# Patient Record
Sex: Male | Born: 1955 | Race: White | Hispanic: No | Marital: Married | Smoking: Former smoker
Health system: Southern US, Community
[De-identification: ages and names within clinical notes are randomized; demographics above are authoritative.]

## PROBLEM LIST (undated history)

## (undated) DIAGNOSIS — E119 Type 2 diabetes mellitus without complications: Secondary | ICD-10-CM

## (undated) DIAGNOSIS — I4891 Unspecified atrial fibrillation: Secondary | ICD-10-CM

## (undated) DIAGNOSIS — I499 Cardiac arrhythmia, unspecified: Secondary | ICD-10-CM

## (undated) DIAGNOSIS — E7800 Pure hypercholesterolemia, unspecified: Secondary | ICD-10-CM

## (undated) DIAGNOSIS — G4730 Sleep apnea, unspecified: Secondary | ICD-10-CM

## (undated) DIAGNOSIS — G629 Polyneuropathy, unspecified: Secondary | ICD-10-CM

## (undated) DIAGNOSIS — M1990 Unspecified osteoarthritis, unspecified site: Secondary | ICD-10-CM

## (undated) HISTORY — DX: Unspecified atrial fibrillation: I48.91

## (undated) HISTORY — PX: CARPAL TUNNEL RELEASE: SHX101

## (undated) HISTORY — PX: TONSILLECTOMY: SUR1361

## (undated) HISTORY — PX: APPENDECTOMY: SHX54

## (undated) HISTORY — PX: TOTAL KNEE ARTHROPLASTY: SHX125

## (undated) HISTORY — DX: Cardiac arrhythmia, unspecified: I49.9

## (undated) HISTORY — PX: JOINT REPLACEMENT: SHX530

---

## 2005-04-16 ENCOUNTER — Ambulatory Visit: Payer: Self-pay | Admitting: Unknown Physician Specialty

## 2009-05-26 ENCOUNTER — Inpatient Hospital Stay (HOSPITAL_COMMUNITY): Admission: RE | Admit: 2009-05-26 | Discharge: 2009-05-28 | Payer: Self-pay | Admitting: Orthopedic Surgery

## 2010-04-19 ENCOUNTER — Ambulatory Visit: Payer: Self-pay | Admitting: Internal Medicine

## 2010-05-06 LAB — URINE CULTURE: Colony Count: NO GROWTH

## 2010-05-06 LAB — URINALYSIS, ROUTINE W REFLEX MICROSCOPIC
Bilirubin Urine: NEGATIVE
Glucose, UA: NEGATIVE mg/dL
Ketones, ur: NEGATIVE mg/dL
Protein, ur: NEGATIVE mg/dL
Specific Gravity, Urine: 1.018 (ref 1.005–1.030)
pH: 6.5 (ref 5.0–8.0)

## 2010-05-06 LAB — BASIC METABOLIC PANEL
BUN: 6 mg/dL (ref 6–23)
CO2: 27 mEq/L (ref 19–32)
Calcium: 8.4 mg/dL (ref 8.4–10.5)
Chloride: 100 mEq/L (ref 96–112)
GFR calc Af Amer: >60 mL/min (ref >60)
GFR calc non Af Amer: >60 mL/min (ref >60)
GFR calc non Af Amer: >60 mL/min (ref >60)
Glucose, Bld: 127 mg/dL — ABNORMAL HIGH (ref 70–99)
Glucose, Bld: 142 mg/dL — ABNORMAL HIGH (ref 70–99)
Potassium: 3.4 mEq/L — ABNORMAL LOW (ref 3.5–5.1)
Potassium: 3.9 mEq/L (ref 3.5–5.1)

## 2010-05-06 LAB — COMPREHENSIVE METABOLIC PANEL
Creatinine, Ser: 0.91 mg/dL (ref 0.4–1.5)
Potassium: 4.2 mEq/L (ref 3.5–5.1)
Sodium: 136 mEq/L (ref 135–145)

## 2010-05-06 LAB — CBC
Hemoglobin: 16 g/dL (ref 13.0–17.0)
MCHC: 34.5 g/dL (ref 30.0–36.0)
MCV: 84.8 fL (ref 78.0–100.0)
MCV: 84.9 fL (ref 78.0–100.0)
Platelets: 213 10*3/uL (ref 150–400)
Platelets: 232 10*3/uL (ref 150–400)
RBC: 4.29 MIL/uL (ref 4.22–5.81)
RBC: 5.47 MIL/uL (ref 4.22–5.81)
RDW: 14.6 % (ref 11.5–15.5)
WBC: 12.8 10*3/uL — ABNORMAL HIGH (ref 4.0–10.5)

## 2010-05-06 LAB — CROSSMATCH: ABO/RH(D): O POS

## 2010-05-06 LAB — PROTIME-INR: INR: 0.99 (ref 0.00–1.49)

## 2010-05-06 LAB — DIFFERENTIAL
Basophils Relative: 1 % (ref 0–1)
Eosinophils Absolute: 0.1 10*3/uL (ref 0.0–0.7)
Lymphocytes Relative: 30 % (ref 12–46)
Lymphs Abs: 3.1 10*3/uL (ref 0.7–4.0)
Monocytes Absolute: 0.8 10*3/uL (ref 0.1–1.0)

## 2010-12-15 ENCOUNTER — Ambulatory Visit: Payer: Self-pay | Admitting: Specialist

## 2010-12-22 ENCOUNTER — Ambulatory Visit: Payer: Self-pay | Admitting: Specialist

## 2011-01-12 ENCOUNTER — Ambulatory Visit: Payer: Self-pay | Admitting: Specialist

## 2012-08-08 DIAGNOSIS — E781 Pure hyperglyceridemia: Secondary | ICD-10-CM | POA: Insufficient documentation

## 2013-01-15 ENCOUNTER — Ambulatory Visit (INDEPENDENT_AMBULATORY_CARE_PROVIDER_SITE_OTHER): Payer: 59 | Admitting: Podiatry

## 2013-01-15 ENCOUNTER — Encounter: Payer: Self-pay | Admitting: Podiatry

## 2013-01-15 VITALS — BP 135/74 | HR 56 | Resp 16 | Ht 70.5 in | Wt 245.0 lb

## 2013-01-15 DIAGNOSIS — M722 Plantar fascial fibromatosis: Secondary | ICD-10-CM

## 2013-01-15 NOTE — Progress Notes (Signed)
Louis Jensen presents today as a 57 year old white male with chronic intractable plantar fascial symptoms of his left foot. He has tried many conservative therapies consisting of; orthotics shoe gear changes, injections, steroids, nonsteroidals, night splints, strappings and physical therapy. None of this has alleviated his symptoms. He still complaining of not being able stand for long periods of time without pain. He relates that he is no longer able to enjoy life and that his left foot limits his daily activities.  Objective: Strong palpable pulses left lower extremity. Pain on palpation medial calcaneal tubercle in the central band of the plantar fascia extending to the medial arch. His toes are tight and hard to move dorsiflexion and plantar flexion.  Assessment: Chronic intractable plantar fasciitis left foot.  Plan: MRI to confirm that her fasciitis for surgical degeneration.

## 2013-01-16 ENCOUNTER — Telehealth: Payer: Self-pay | Admitting: *Deleted

## 2013-01-16 NOTE — Telephone Encounter (Signed)
CALLED AND SPOKE WITH PT LETTING HIM KNOW MRI IS SCHEDULED FOR MON 12.8.14 AT 3:45. WILL NEED TO ARRIVE AT 3:15 AT [facility]

## 2013-01-22 ENCOUNTER — Ambulatory Visit: Payer: Self-pay | Admitting: Podiatry

## 2013-02-12 ENCOUNTER — Telehealth: Payer: Self-pay | Admitting: Podiatry

## 2013-02-12 ENCOUNTER — Encounter: Payer: Self-pay | Admitting: Podiatry

## 2013-02-12 NOTE — Telephone Encounter (Signed)
Left message with wife for him to schedule an appointment for MRI results

## 2013-02-14 ENCOUNTER — Encounter: Payer: Self-pay | Admitting: Podiatry

## 2013-02-14 ENCOUNTER — Ambulatory Visit (INDEPENDENT_AMBULATORY_CARE_PROVIDER_SITE_OTHER): Payer: 59 | Admitting: Podiatry

## 2013-02-14 VITALS — BP 140/81 | HR 62 | Resp 16 | Ht 70.0 in | Wt 245.0 lb

## 2013-02-14 DIAGNOSIS — B351 Tinea unguium: Secondary | ICD-10-CM

## 2013-02-14 DIAGNOSIS — B353 Tinea pedis: Secondary | ICD-10-CM

## 2013-02-14 DIAGNOSIS — M722 Plantar fascial fibromatosis: Secondary | ICD-10-CM

## 2013-02-14 MED ORDER — CEPHALEXIN 500 MG PO CAPS: 500.0000 mg | ORAL_CAPSULE | Freq: Four times a day (QID) | ORAL | Status: DC

## 2013-02-14 MED ORDER — NAFTIFINE HCL 2 % EX CREA: 1.0000 "application " | TOPICAL_CREAM | CUTANEOUS | Status: DC

## 2013-02-14 NOTE — Progress Notes (Signed)
He presents today for his MRI results of his left foot. Currently he is still has heel pain he also has a violaceous rash to the plantar aspect of each foot with vesicular purulent eruptions small petechial lesions and dry xerotic skin.  Objective: We discussed the MRI today he still has pain on palpation medial continued tubercle of his left heel. Violaceous rash noted as above.  Assessment: Chronic intractable plantar fasciitis left. Dermatitis probable tinea pedis rule out eczematous dermatitis.  Plan: Injected his left heel with Kenalog and local anesthetic today. He started him on Keflex 500 mg 1 by mouth 3 times a day. Also started him on Naftin cream.

## 2013-02-28 ENCOUNTER — Ambulatory Visit: Payer: 59 | Admitting: Podiatry

## 2013-03-05 ENCOUNTER — Encounter: Payer: Self-pay | Admitting: Podiatry

## 2013-03-05 ENCOUNTER — Ambulatory Visit (INDEPENDENT_AMBULATORY_CARE_PROVIDER_SITE_OTHER): Payer: 59 | Admitting: Podiatry

## 2013-03-05 VITALS — BP 127/72 | HR 74 | Resp 18

## 2013-03-05 DIAGNOSIS — L309 Dermatitis, unspecified: Secondary | ICD-10-CM

## 2013-03-05 DIAGNOSIS — L259 Unspecified contact dermatitis, unspecified cause: Secondary | ICD-10-CM

## 2013-03-05 MED ORDER — DESOXIMETASONE 0.25 % EX CREA: 1.0000 "application " | TOPICAL_CREAM | Freq: Two times a day (BID) | CUTANEOUS | Status: DC

## 2013-03-05 NOTE — Progress Notes (Signed)
The heel is about the same , the dermatitis is good on left , terrible on the right foot.  Objective evaluation: Vital signs are stable he is alert and oriented x3. Dermatitis to the left foot is completely resolved. Right foot does demonstrate erythema and violaceous gold plaques with drainage and malodor. Minimal tenderness on palpation of the medial continued tubercles bilateral.  Assessment: At this point I will add Topicort to his regimen. We are also going to send him to aliments dermatology.  Plan: Adding Topicort sending to dermatology and will await the findings. Once this is cleared for surgery will be performed.

## 2013-03-26 ENCOUNTER — Ambulatory Visit: Payer: 59 | Admitting: Podiatry

## 2013-03-28 ENCOUNTER — Ambulatory Visit: Payer: 59 | Admitting: Podiatry

## 2014-01-14 ENCOUNTER — Ambulatory Visit (INDEPENDENT_AMBULATORY_CARE_PROVIDER_SITE_OTHER): Payer: 59 | Admitting: Podiatry

## 2014-01-14 VITALS — BP 120/80 | HR 66 | Resp 16

## 2014-01-14 DIAGNOSIS — M722 Plantar fascial fibromatosis: Secondary | ICD-10-CM

## 2014-01-14 NOTE — Progress Notes (Signed)
He presents today states that his left foot is just killing him. Is beginning to affect his ability to perform his daily activities and continue with his exercise regimen. He states that he is recently started to feel some shooting pain along the medial longitudinal arch particularly at night after he goes to bed.  Objective: Vital signs are stable he is alert and oriented 3. Pulses are strongly palpable left foot. He has pain on palpation medial calcaneal tubercle of the left heel. Allograft assessment: Chronic intractable plantar fasciitis of the left foot. Also demonstrating some signs of tarsal tunnel possibly associated with compensation or inflammation of the  posterior tibial tendon and the tarsal canal.  Assessment: Chronic intractable plantar fasciitis left heel.  Plan: Discussed etiology pathology conservative versus surgical therapies. We discussed in great detail today and endoscopic plantar fasciotomy of the left foot. I answered all the questions regarding this procedure to the best of my ability in layman's terms. We went over a consent form today line by line number by number giving him ample time to ask questions he saw fit. We also discussed the possible postop complications which may include but are not limited to postop pain bleeding swelling infection need for further surgery recurrence increase in pain. He signed all 3 pages of the consent form he was dispensed a Cam Walker and we will perform surgical procedure in January.

## 2014-02-03 ENCOUNTER — Ambulatory Visit: Payer: Self-pay | Admitting: Physician Assistant

## 2014-02-19 ENCOUNTER — Telehealth: Payer: Self-pay | Admitting: *Deleted

## 2014-02-19 NOTE — Telephone Encounter (Signed)
Returned pts call regarding his message to r/s surgery. Called and left message for pt to call back to r/s surgery.

## 2014-03-13 ENCOUNTER — Encounter: Payer: 59 | Admitting: Podiatry

## 2014-03-20 ENCOUNTER — Other Ambulatory Visit: Payer: Self-pay | Admitting: Podiatry

## 2014-03-20 MED ORDER — PROMETHAZINE HCL 25 MG PO TABS: 25.0000 mg | ORAL_TABLET | Freq: Three times a day (TID) | ORAL | Status: DC | PRN

## 2014-03-20 MED ORDER — OXYCODONE-ACETAMINOPHEN 10-325 MG PO TABS: ORAL_TABLET | ORAL | Status: DC

## 2014-03-20 MED ORDER — CEPHALEXIN 500 MG PO CAPS: 500.0000 mg | ORAL_CAPSULE | Freq: Three times a day (TID) | ORAL | Status: DC

## 2014-03-22 ENCOUNTER — Encounter: Payer: Self-pay | Admitting: Podiatry

## 2014-03-22 DIAGNOSIS — M722 Plantar fascial fibromatosis: Secondary | ICD-10-CM

## 2014-03-27 ENCOUNTER — Ambulatory Visit (INDEPENDENT_AMBULATORY_CARE_PROVIDER_SITE_OTHER): Payer: 59 | Admitting: Podiatry

## 2014-03-27 ENCOUNTER — Encounter: Payer: Self-pay | Admitting: Podiatry

## 2014-03-27 VITALS — BP 143/84 | HR 64 | Resp 12

## 2014-03-27 DIAGNOSIS — Z9889 Other specified postprocedural states: Secondary | ICD-10-CM

## 2014-03-27 NOTE — Progress Notes (Signed)
He presents today 5 days status post endoscopic plantar fasciotomy of his left foot. He denies fever chills nausea vomiting muscle aches and pains.  Objective: Vital signs are stable he is alert and oriented 3. Dry sterile dressing was removed demonstrates mild edema mild ecchymosis sutures are intact margins are coapted IC no signs of infection. Moderately tender on palpation.  Assessment: One-week status post endoscopic plantar fasciotomy. Date of surgery 03/22/2014.  Plan: Redress today. I will allow him to start soaking this in Epsom salts and warm water he is to continue to wear the Cam Walker around the clock. Follow up with him in 1 week for suture removal.

## 2014-03-29 NOTE — Progress Notes (Signed)
DOS 03/22/2014 left endoscopic plantar fasciotomy

## 2014-04-03 ENCOUNTER — Ambulatory Visit (INDEPENDENT_AMBULATORY_CARE_PROVIDER_SITE_OTHER): Payer: 59 | Admitting: Podiatry

## 2014-04-03 VITALS — BP 125/77 | HR 63 | Temp 97.7°F | Resp 16

## 2014-04-03 DIAGNOSIS — Z9889 Other specified postprocedural states: Secondary | ICD-10-CM

## 2014-04-03 NOTE — Progress Notes (Signed)
He presents today 2 weeks status post endoscopic plantar fasciotomy left foot. He denies fever chills nausea vomiting muscle aches and pains. He states that he has been sleeping without the Cam Walker.  Objective: Vital signs are stable he is alert and oriented 3 sutures were removed today margins remain well coapted. He has minimal pain on palpation medial calcaneal tubercle. I see no signs of infection.  Assessment: Well-healing endoscopic plantar fasciotomy left foot.  Plan: I will have him back in a pair of tennis shoes with his orthotics and he will continue to utilize the night splint for the next month. I will follow-up with him in 2 weeks.

## 2014-04-17 ENCOUNTER — Ambulatory Visit (INDEPENDENT_AMBULATORY_CARE_PROVIDER_SITE_OTHER): Payer: 59 | Admitting: Podiatry

## 2014-04-17 VITALS — BP 133/71 | HR 63 | Resp 16

## 2014-04-17 DIAGNOSIS — Z9889 Other specified postprocedural states: Secondary | ICD-10-CM

## 2014-04-17 NOTE — Progress Notes (Signed)
He presents today approximately one-month status post EPF left. He still having considerable pain with ambulation and cannot stand for long periods of time. He denies fever chills nausea vomiting muscle aches and pains.  Objective: Vital signs are stable he is alert and oriented 3. Pulses are palpable left foot. He has tenderness on palpation medial continued tubercle of the left heel margins appear to be well-healed and surgical sites are healed. Mild tenderness on palpation of the plantar medial and plantar central calcaneal tubercle.  Assessment: Endoscopic plantar fasciotomy progressing well.  Plan: Encouraged range of motion exercises massage therapy shoe gear modifications and ambulation. He will continue to utilize his night splint. Follow up with him in 2 weeks

## 2014-05-06 ENCOUNTER — Ambulatory Visit (INDEPENDENT_AMBULATORY_CARE_PROVIDER_SITE_OTHER): Payer: 59 | Admitting: Podiatry

## 2014-05-06 ENCOUNTER — Encounter: Payer: Self-pay | Admitting: Podiatry

## 2014-05-06 VITALS — BP 129/91 | HR 67 | Resp 16

## 2014-05-06 DIAGNOSIS — Z9889 Other specified postprocedural states: Secondary | ICD-10-CM

## 2014-05-06 NOTE — Progress Notes (Signed)
He presents now approximately 7 weeks status post endoscopic plantar fasciotomy of his left heel. He states this still hurts particularly after his been standing for long period of time. He continues physical therapy at home and continues massage therapy for scar therapy.  Objective: Vital signs are stable he is alert and oriented 3 asthma mild tenderness to the incision site of his left heel. There is no erythema edema saline as drainage or odor.  Assessment: Encouraged massage therapy and ambulation.  Plan: I'm going to suggest that he remain out of work one additional week. This should give him 2 weeks to develop a mean bite which to decrease his symptoms.

## 2014-05-20 ENCOUNTER — Ambulatory Visit (INDEPENDENT_AMBULATORY_CARE_PROVIDER_SITE_OTHER): Payer: 59 | Admitting: Podiatry

## 2014-05-20 ENCOUNTER — Encounter: Payer: Self-pay | Admitting: Podiatry

## 2014-05-20 VITALS — BP 140/90 | HR 53 | Resp 18 | Ht 70.5 in | Wt 248.0 lb

## 2014-05-20 DIAGNOSIS — Z9889 Other specified postprocedural states: Secondary | ICD-10-CM

## 2014-05-20 DIAGNOSIS — M722 Plantar fascial fibromatosis: Secondary | ICD-10-CM

## 2014-05-20 NOTE — Progress Notes (Signed)
He presents today for follow-up of his endoscopic plantar fasciotomy of his right foot. He states that he is not 100% yet but approximately 50% improved prior to surgery.  Objective: Vital signs are stable he is alert and oriented 3 he has minimal tenderness on palpation medial calcaneal tubercle of the right heel. No erythema edema cellulitis drainage or odor.  Assessment: Well-healing surgical endoscopic plantar fasciotomy right foot.  Plan: I would allow him to go back to work at this point he'll start Wednesday. I will follow-up with him in 1 month

## 2014-06-03 DIAGNOSIS — R7303 Prediabetes: Secondary | ICD-10-CM | POA: Insufficient documentation

## 2014-06-17 ENCOUNTER — Ambulatory Visit (INDEPENDENT_AMBULATORY_CARE_PROVIDER_SITE_OTHER): Payer: 59 | Admitting: Podiatry

## 2014-06-17 ENCOUNTER — Encounter: Payer: Self-pay | Admitting: Podiatry

## 2014-06-17 VITALS — Ht 70.0 in | Wt 250.0 lb

## 2014-06-17 DIAGNOSIS — G629 Polyneuropathy, unspecified: Secondary | ICD-10-CM

## 2014-06-17 NOTE — Progress Notes (Signed)
He presents today status post endoscopic plantar fasciotomy January 2016. He states that now he is starting to develop numbness and tingling in his contralateral foot where as it had been just in the surgical foot for many months. He denies any numbness or tingling in his fingertips and does relate that recently he had been diagnosed as a prediabetic. The changes in his past medical history medications or allergies. He states that the numbness becomes painful throughout the day and changes to burning and tingling in the forefoot at night. His symptoms are limited primarily to the forefoot. He no longer has heel pain. Denies history of back pain or back injury or surgery. He does relate weakness in his calfs bilateral. He also states that he gets calf pain on a regular basis.  Objective: Vital signs are stable he is alert and oriented 3. Neurologic sensorium is intact per Semmes-Weinstein monofilament. Pulses remain intact bilateral. No reproducible pain on physical examination to the forefoot or the calves.  Assessment: Well-healing endoscopic plantar fasciotomy left foot. Neuropathy bilaterally.  Plan: At this point with this increasing onset of neuropathy I am requesting a nerve conduction velocity exam as well as an EMG.

## 2014-06-20 ENCOUNTER — Telehealth: Payer: Self-pay | Admitting: Podiatry

## 2014-06-20 NOTE — Telephone Encounter (Signed)
Louis FusiPaula from Dr Alver FisherShaw's office called pt has a new pt on 5/26 at 215pm that was scheduled by [place] office and also they got a faxed order for just a nerve conductive study. Does pt need just the nerve conductive study or does he need both.Please call Louis Fusiaula back at [phone].

## 2014-06-20 NOTE — Telephone Encounter (Signed)
Louis Jensen FROM DR SHAW'S OFFICE CALLED YOU BACK AND ASKED FOR YOU TO CALL HER TOMORROW BETWEEN 8 AND 12.

## 2014-06-20 NOTE — Telephone Encounter (Signed)
RETURNED [name]'S CALL , Louis Jensen IS NEEDING TO SEE DR AND NERVE CONDUCTION STUDY

## 2014-07-01 DIAGNOSIS — G629 Polyneuropathy, unspecified: Secondary | ICD-10-CM | POA: Insufficient documentation

## 2014-07-21 ENCOUNTER — Encounter: Payer: Self-pay | Admitting: Gynecology

## 2014-07-21 ENCOUNTER — Ambulatory Visit
Admission: EM | Admit: 2014-07-21 | Discharge: 2014-07-21 | Disposition: A | Discharge status: Home / Self Care | Payer: 59 | Attending: Family Medicine | Admitting: Family Medicine

## 2014-07-21 DIAGNOSIS — J069 Acute upper respiratory infection, unspecified: Secondary | ICD-10-CM

## 2014-07-21 DIAGNOSIS — J209 Acute bronchitis, unspecified: Secondary | ICD-10-CM | POA: Diagnosis not present

## 2014-07-21 DIAGNOSIS — H6593 Unspecified nonsuppurative otitis media, bilateral: Secondary | ICD-10-CM

## 2014-07-21 HISTORY — DX: Pure hypercholesterolemia, unspecified: E78.00

## 2014-07-21 MED ORDER — ALBUTEROL SULFATE HFA 108 (90 BASE) MCG/ACT IN AERS: 1.0000 | INHALATION_SPRAY | RESPIRATORY_TRACT | Status: DC | PRN

## 2014-07-21 MED ORDER — AZITHROMYCIN 250 MG PO TABS
250.0000 mg | ORAL_TABLET | Freq: Every day | ORAL | Status: DC
Start: 2014-07-21 — End: 2015-08-11

## 2014-07-21 MED ORDER — IPRATROPIUM-ALBUTEROL 0.5-2.5 (3) MG/3ML IN SOLN
3.0000 mL | Freq: Once | RESPIRATORY_TRACT | Status: AC
  Administered 2014-07-21: 3 mL via RESPIRATORY_TRACT

## 2014-07-21 MED ORDER — PSEUDOEPHEDRINE HCL 30 MG PO TABS: 30.0000 mg | ORAL_TABLET | ORAL | Status: DC | PRN

## 2014-07-21 MED ORDER — BENZONATATE 100 MG PO CAPS: 100.0000 mg | ORAL_CAPSULE | Freq: Three times a day (TID) | ORAL | Status: DC | PRN

## 2014-07-21 NOTE — Discharge Instructions (Signed)

## 2014-07-21 NOTE — ED Provider Notes (Signed)
CSN: [id number]     Arrival date & time 07/21/14  1116 History   First MD Initiated Contact with Patient 07/21/14 1135     Chief Complaint  Patient presents with  . Cough   (Consider location/radiation/quality/duration/timing/severity/associated sxs/prior Treatment) HPI Comments: Caucasian male granddaughter with illness first, then spouse now him.  Nonproductive cough, post nasal drip, watery eyes, nasal congestion, out of town trip planned for vacation 9 Jun.  The history is provided by the patient.    Past Medical History  Diagnosis Date  . Hypercholesteremia    Past Surgical History  Procedure Laterality Date  . Total knee arthroplasty     History reviewed. No pertinent family history. History  Substance Use Topics  . Smoking status: Former Games developer  . Smokeless tobacco: Never Used     Comment: quit  . Alcohol Use: No    Review of Systems  Constitutional: Negative for fever, chills, diaphoresis, activity change, appetite change and fatigue.  HENT: Positive for congestion, postnasal drip, rhinorrhea and sneezing. Negative for dental problem, drooling, ear discharge, ear pain, facial swelling, hearing loss, mouth sores, nosebleeds, sinus pressure, sore throat, tinnitus, trouble swallowing and voice change.   Eyes: Positive for discharge. Negative for photophobia, pain, redness, itching and visual disturbance.  Respiratory: Positive for cough. Negative for choking, chest tightness, shortness of breath, wheezing and stridor.   Cardiovascular: Negative for chest pain, palpitations and leg swelling.  Gastrointestinal: Negative for nausea, vomiting, abdominal pain, diarrhea, constipation, blood in stool, abdominal distention and rectal pain.  Endocrine: Negative for cold intolerance and heat intolerance.  Genitourinary: Negative for hematuria and difficulty urinating.  Musculoskeletal: Negative for myalgias, back pain, joint swelling and arthralgias.  Skin: Negative for color  change, pallor, rash and wound.  Allergic/Immunologic: Negative for environmental allergies and food allergies.  Neurological: Negative for tremors, seizures, syncope, facial asymmetry, speech difficulty, weakness, light-headedness, numbness and headaches.  Hematological: Negative for adenopathy. Does not bruise/bleed easily.  Psychiatric/Behavioral: Negative for behavioral problems, confusion, sleep disturbance and agitation.    Allergies  Review of patient's allergies indicates no known allergies.  Home Medications   Prior to Admission medications   Medication Sig Start Date End Date Taking? Authorizing Provider  albuterol (PROVENTIL HFA;VENTOLIN HFA) 108 (90 BASE) MCG/ACT inhaler Inhale 1-2 puffs into the lungs every 4 (four) hours as needed for wheezing or shortness of breath. 07/21/14   Barbaraann Barthel, NP  atorvastatin (LIPITOR) 40 MG tablet  06/06/14   Historical Provider, MD  azithromycin (ZITHROMAX) 250 MG tablet Take 1 tablet (250 mg total) by mouth daily. Take first 2 tablets together, then 1 every day until finished. 07/21/14   Barbaraann Barthel, NP  benzonatate (TESSALON) 100 MG capsule Take 1 capsule (100 mg total) by mouth 3 (three) times daily as needed for cough. 07/21/14   Barbaraann Barthel, NP  pseudoephedrine (SUDAFED) 30 MG tablet Take 1 tablet (30 mg total) by mouth every 4 (four) hours as needed for congestion (max 8 tabs in 24 hours ( )). 07/21/14   Jarold Song Roderick Sweezy, NP   BP 125/70 mmHg  Pulse 79  Temp(Src) 98.6 F (37 C) (Oral)  Ht  (1.778 m)  Wt 248 lb (112.492 kg)  BMI 35.58 kg/m2  SpO2 97% Physical Exam  Constitutional: He is oriented to person, place, and time. Vital signs are normal. He appears well-developed and well-nourished. No distress. He is not intubated.  HENT:  Head: Normocephalic and atraumatic.  Right Ear: Hearing, external ear and  ear canal normal. A middle ear effusion is present.  Left Ear: Hearing, external ear and ear canal normal. A  middle ear effusion is present.  Nose: Nose normal.  Mouth/Throat: Mucous membranes are normal. Mucous membranes are not pale, not dry and not cyanotic. He does not have dentures. No trismus in the jaw. Normal dentition. No dental abscesses, lacerations or dental caries. Posterior oropharyngeal edema and posterior oropharyngeal erythema present. No oropharyngeal exudate or tonsillar abscesses.  Bilateral TMs with air fluid level; nasal turbinates with edema/erythema clear discharge, cobblestoning posterior pharynx, intermittent nonproductive cough erythema posterior pharynx scarring mid palate no uvula  Eyes: Conjunctivae, EOM and lids are normal. Pupils are equal, round, and reactive to light. Right eye exhibits no discharge. Left eye exhibits no discharge. No scleral icterus.  Neck: Trachea normal and normal range of motion. Neck supple. No tracheal deviation present. No thyromegaly present.  Cardiovascular: Normal rate, regular rhythm, normal heart sounds and intact distal pulses.  Exam reveals no gallop and no friction rub.   No murmur heard. Pulmonary/Chest: Effort normal. No accessory muscle usage. No apnea, no tachypnea and no bradypnea. He is not intubated. No respiratory distress. He has decreased breath sounds in the right lower field and the left lower field. He has wheezes in the right upper field and the left upper field. He has no rhonchi. He has no rales. He exhibits no tenderness.  Inspiratory wheeze fine prior to duoneb resolved after duoneb and improved airflow bilateral lower lobes spo2 99% room air  Abdominal: Soft. Bowel sounds are normal. He exhibits no distension and no mass. There is no tenderness. There is no rebound and no guarding.  Musculoskeletal: Normal range of motion. He exhibits no edema or tenderness.  Lymphadenopathy:    He has no cervical adenopathy.  Neurological: He is alert and oriented to person, place, and time. Coordination normal.  Skin: Skin is warm, dry and  intact. No rash noted. He is not diaphoretic. No erythema. No pallor.  Psychiatric: He has a normal mood and affect. His speech is normal and behavior is normal. Judgment and thought content normal. Cognition and memory are normal.  Nursing note and vitals reviewed.   ED Course  Procedures (including critical care time) Labs Review Labs Reviewed - No data to display  Imaging Review No results found.   MDM   1. Bronchitis, acute, with bronchospasm   2. Otitis media with effusion, bilateral   3. Viral URI    Tessalon pearles po TID prn; albuterol inhaler 2 puffs po q4-6h prn cough/wheeze.  Sudafed $RemoveBefor [id number]$[id number]_[id number]$240mgred, may have started as viral (probably respiratory syncytial, parainfluenza, influenza, or adenovirus), but now evidence of acute purulent bronchitis with resultant bronchial edema and mucus formation.  Differential Diagnoses:  Reactive Airway Disease (asthma, allergic aspergillosis eosinophilia), chronic bronchitis, respiratory infection (sinusitis, common cold, pneumonia), congestive heart failure, smoke/irritant exposure, reflux esophagitis, bronchogenic tumor, and/or aspiration syndromes.  I will give Zithromax for five days for possible Mycoplasma.  Without high fever, severe dyspnea and lack of physical findings or risk factors will hold on chest radiograph and CBC at this time.  I discussed that approximately 50% of patients with acute bronchitis have a cough that lasts up to three weeks, and 25% for over a month. Tylenol, one to two tablets every four hours as needed for fever or myalgias.   No aspirin. Patient instructed to follow up in one week  or sooner if symptoms worsen. Patient verbalized agreement and understanding of treatment plan.  P2:  hand washing and cover cough Supportive treatment.   No evidence of invasive bacterial infection, non toxic and well hydrated.  This is most likely self limiting  viral infection.  I do not see where any further testing or imaging is necessary at this time.   I will suggest supportive care, rest, good hygiene and encourage the patient to take adequate fluids.  The patient is to return to clinic or EMERGENCY ROOM if symptoms worsen or change significantly e.g. ear pain, fever, purulent discharge from ears or bleeding.  Exitcare handout on otitis media with effusion given to patient.  Patient verbalized agreement and understanding of treatment plan.    Barbaraann Barthel, NP 07/21/14 1731

## 2014-07-21 NOTE — ED Notes (Signed)
Patient c/o cough head congestion x yesterday.

## 2015-02-06 ENCOUNTER — Ambulatory Visit
Admission: EM | Admit: 2015-02-06 | Discharge: 2015-02-06 | Disposition: A | Discharge status: Home / Self Care | Payer: 59 | Attending: Family Medicine | Admitting: Family Medicine

## 2015-02-06 DIAGNOSIS — J069 Acute upper respiratory infection, unspecified: Secondary | ICD-10-CM | POA: Diagnosis not present

## 2015-02-06 DIAGNOSIS — J0110 Acute frontal sinusitis, unspecified: Secondary | ICD-10-CM | POA: Diagnosis not present

## 2015-02-06 HISTORY — DX: Polyneuropathy, unspecified: G62.9

## 2015-02-06 MED ORDER — DOXYCYCLINE HYCLATE 100 MG PO CAPS: 100.0000 mg | ORAL_CAPSULE | Freq: Two times a day (BID) | ORAL | Status: DC

## 2015-02-06 MED ORDER — LORATADINE-PSEUDOEPHEDRINE ER 5-120 MG PO TB12: 1.0000 | ORAL_TABLET | Freq: Two times a day (BID) | ORAL | Status: AC

## 2015-02-06 MED ORDER — GUAIFENESIN-CODEINE 100-10 MG/5ML PO SOLN
5.0000 mL | Freq: Three times a day (TID) | ORAL | Status: DC | PRN
Start: 2015-02-06 — End: 2015-08-11

## 2015-02-06 NOTE — Discharge Instructions (Signed)
Take medication as prescribed. Rest. Drink plenty of fluids. Eat regularly.  Follow-up closely with her primary care physician. Return to urgent care as needed for new or worsening concerns.  Upper Respiratory Infection, Adult Most upper respiratory infections (URIs) are a viral infection of the air passages leading to the lungs. A URI affects the nose, throat, and upper air passages. The most common type of URI is nasopharyngitis and is typically referred to as "the common cold." URIs run their course and usually go away on their own. Most of the time, a URI does not require medical attention, but sometimes a bacterial infection in the upper airways can follow a viral infection. This is called a secondary infection. Sinus and middle ear infections are common types of secondary upper respiratory infections. Bacterial pneumonia can also complicate a URI. A URI can worsen asthma and chronic obstructive pulmonary disease (COPD). Sometimes, these complications can require emergency medical care and may be life threatening.  CAUSES Almost all URIs are caused by viruses. A virus is a type of germ and can spread from one person to another.  RISKS FACTORS You may be at risk for a URI if:   You smoke.   You have chronic heart or lung disease.  You have a weakened defense (immune) system.   You are very young or very old.   You have nasal allergies or asthma.  You work in crowded or poorly ventilated areas.  You work in health care facilities or schools. SIGNS AND SYMPTOMS  Symptoms typically develop 2-3 days after you come in contact with a cold virus. Most viral URIs last 7-10 days. However, viral URIs from the influenza virus (flu virus) can last 14-18 days and are typically more severe. Symptoms may include:   Runny or stuffy (congested) nose.   Sneezing.   Cough.   Sore throat.   Headache.   Fatigue.   Fever.   Loss of appetite.   Pain in your forehead, behind your  eyes, and over your cheekbones (sinus pain).  Muscle aches.  DIAGNOSIS  Your health care provider may diagnose a URI by:  Physical exam.  Tests to check that your symptoms are not due to another condition such as:  Strep throat.  Sinusitis.  Pneumonia.  Asthma. TREATMENT  A URI goes away on its own with time. It cannot be cured with medicines, but medicines may be prescribed or recommended to relieve symptoms. Medicines may help:  Reduce your fever.  Reduce your cough.  Relieve nasal congestion. HOME CARE INSTRUCTIONS   Take medicines only as directed by your health care provider.   Gargle warm saltwater or take cough drops to comfort your throat as directed by your health care provider.  Use a warm mist humidifier or inhale steam from a shower to increase air moisture. This may make it easier to breathe.  Drink enough fluid to keep your urine clear or pale yellow.   Eat soups and other clear broths and maintain good nutrition.   Rest as needed.   Return to work when your temperature has returned to normal or as your health care provider advises. You may need to stay home longer to avoid infecting others. You can also use a face mask and careful hand washing to prevent spread of the virus.  Increase the usage of your inhaler if you have asthma.   Do not use any tobacco products, including cigarettes, chewing tobacco, or electronic cigarettes. If you need help quitting, ask your  health care provider. PREVENTION  The best way to protect yourself from getting a cold is to practice good hygiene.   Avoid oral or hand contact with people with cold symptoms.   Wash your hands often if contact occurs.  There is no clear evidence that vitamin C, vitamin E, echinacea, or exercise reduces the chance of developing a cold. However, it is always recommended to get plenty of rest, exercise, and practice good nutrition.  SEEK MEDICAL CARE IF:   You are getting worse  rather than better.   Your symptoms are not controlled by medicine.   You have chills.  You have worsening shortness of breath.  You have brown or red mucus.  You have yellow or brown nasal discharge.  You have pain in your face, especially when you bend forward.  You have a fever.  You have swollen neck glands.  You have pain while swallowing.  You have white areas in the back of your throat. SEEK IMMEDIATE MEDICAL CARE IF:   You have severe or persistent:  Headache.  Ear pain.  Sinus pain.  Chest pain.  You have chronic lung disease and any of the following:  Wheezing.  Prolonged cough.  Coughing up blood.  A change in your usual mucus.  You have a stiff neck.  You have changes in your:  Vision.  Hearing.  Thinking.  Mood. MAKE SURE YOU:   Understand these instructions.  Will watch your condition.  Will get help right away if you are not doing well or get worse.   This information is not intended to replace advice given to you by your health care provider. Make sure you discuss any questions you have with your health care provider.   Document Released: 07/28/2000 Document Revised: 06/18/2014 Document Reviewed: 05/09/2013 Elsevier Interactive Patient Education 2016 [employer]  Sinusitis, Adult Sinusitis is redness, soreness, and inflammation of the paranasal sinuses. Paranasal sinuses are air pockets within the bones of your face. They are located beneath your eyes, in the middle of your forehead, and above your eyes. In healthy paranasal sinuses, mucus is able to drain out, and air is able to circulate through them by way of your nose. However, when your paranasal sinuses are inflamed, mucus and air can become trapped. This can allow bacteria and other germs to grow and cause infection. Sinusitis can develop quickly and last only a short time (acute) or continue over a long period (chronic). Sinusitis that lasts for more than 12 weeks is  considered chronic. CAUSES Causes of sinusitis include:  Allergies.  Structural abnormalities, such as displacement of the cartilage that separates your nostrils (deviated septum), which can decrease the air flow through your nose and sinuses and affect sinus drainage.  Functional abnormalities, such as when the small hairs (cilia) that line your sinuses and help remove mucus do not work properly or are not present. SIGNS AND SYMPTOMS Symptoms of acute and chronic sinusitis are the same. The primary symptoms are pain and pressure around the affected sinuses. Other symptoms include:  Upper toothache.  Earache.  Headache.  Bad breath.  Decreased sense of smell and taste.  A cough, which worsens when you are lying flat.  Fatigue.  Fever.  Thick drainage from your nose, which often is green and may contain pus (purulent).  Swelling and warmth over the affected sinuses. DIAGNOSIS Your health care provider will perform a physical exam. During your exam, your health care provider may perform any of the following to  help determine if you have acute sinusitis or chronic sinusitis:  Look in your nose for signs of abnormal growths in your nostrils (nasal polyps).  Tap over the affected sinus to check for signs of infection.  View the inside of your sinuses using an imaging device that has a light attached (endoscope). If your health care provider suspects that you have chronic sinusitis, one or more of the following tests may be recommended:  Allergy tests.  Nasal culture. A sample of mucus is taken from your nose, sent to a lab, and screened for bacteria.  Nasal cytology. A sample of mucus is taken from your nose and examined by your health care provider to determine if your sinusitis is related to an allergy. TREATMENT Most cases of acute sinusitis are related to a viral infection and will resolve on their own within 10 days. Sometimes, medicines are prescribed to help relieve  symptoms of both acute and chronic sinusitis. These may include pain medicines, decongestants, nasal steroid sprays, or saline sprays. However, for sinusitis related to a bacterial infection, your health care provider will prescribe antibiotic medicines. These are medicines that will help kill the bacteria causing the infection. Rarely, sinusitis is caused by a fungal infection. In these cases, your health care provider will prescribe antifungal medicine. For some cases of chronic sinusitis, surgery is needed. Generally, these are cases in which sinusitis recurs more than 3 times per year, despite other treatments. HOME CARE INSTRUCTIONS  Drink plenty of water. Water helps thin the mucus so your sinuses can drain more easily.  Use a humidifier.  Inhale steam 3-4 times a day (for example, sit in the bathroom with the shower running).  Apply a warm, moist washcloth to your face 3-4 times a day, or as directed by your health care provider.  Use saline nasal sprays to help moisten and clean your sinuses.  Take medicines only as directed by your health care provider.  If you were prescribed either an antibiotic or antifungal medicine, finish it all even if you start to feel better. SEEK IMMEDIATE MEDICAL CARE IF:  You have increasing pain or severe headaches.  You have nausea, vomiting, or drowsiness.  You have swelling around your face.  You have vision problems.  You have a stiff neck.  You have difficulty breathing.   This information is not intended to replace advice given to you by your health care provider. Make sure you discuss any questions you have with your health care provider.   Document Released: 02/01/2005 Document Revised: 02/22/2014 Document Reviewed: 02/16/2011 Elsevier Interactive Patient Education [employer]

## 2015-02-06 NOTE — ED Provider Notes (Signed)
[facility]  ____________________________________________  Time seen: Approximately 3:37 PM  I have reviewed the triage vital signs and the nursing notes.   HISTORY Chief Complaint Facial Pain   HPI Louis Jensen is a 59 y.o. male  presents with complaints of 2-3 days of runny nose, nasal congestion, sinus pressure and face and behind the eyes, intermittent cough. Patient states that cough is primarily at night and feels the drainage in the back of throat. Denies fevers. Reports continues to eat and drink well.  States current sinus pressure is described as an aching pain at 4 out of 10. Denies pain radiation. Denies chest pain, shortness breath, abdominal pain, neck or back pain, dizziness, vision changes, or weakness. Denies known sick contacts.  Denies recent sickness. Denies recent antibiotic use.    Past Medical History  Diagnosis Date  . Hypercholesteremia   . Hypercholesteremia   . Neuropathy (HCC)     There are no active problems to display for this patient.   Past Surgical History  Procedure Laterality Date  . Total knee arthroplasty    . Joint replacement    . Tonsillectomy     PCP: Zada Finders  Current Outpatient Rx  Name  Route  Sig  Dispense  Refill  . atorvastatin (LIPITOR) 40 MG tablet            11   . gabapentin (NEURONTIN) 300 MG capsule   Oral   Take 300 mg by mouth 3 (three) times daily.         Marland Kitchen albuterol (PROVENTIL HFA;VENTOLIN HFA) 108 (90 BASE) MCG/ACT inhaler   Inhalation   Inhale 1-2 puffs into the lungs every 4 (four) hours as needed for wheezing or shortness of breath.   1 Inhaler   0   .           .           .             Allergies Review of patient's allergies indicates no known allergies.  History reviewed. No pertinent family history.  Social History Social History  Substance Use Topics  . Smoking status: Former Games developer  . Smokeless tobacco: Never Used     Comment: quit  . Alcohol Use: Yes      Comment: socially    Review of Systems Constitutional: No fever/chills Eyes: No visual changes. VHQ:IONGEXBM runny nose, nasal congestion, intermittent cough and intermittent sore throat.  Cardiovascular: Denies chest pain. Respiratory: Denies shortness of breath. Gastrointestinal: No abdominal pain.  No nausea, no vomiting.  No diarrhea.  No constipation. Genitourinary: Negative for dysuria. Musculoskeletal: Negative for back pain. Skin: Negative for rash. Neurological: Negative for headaches, focal weakness or numbness.  10-point ROS otherwise negative.  ____________________________________________   PHYSICAL EXAM:  VITAL SIGNS: ED Triage Vitals  Enc Vitals Group     BP 02/06/15 1511 127/81 mmHg     Pulse Rate 02/06/15 1511 80     Resp 02/06/15 1511 17     Temp 02/06/15 1511 99.3 F (37.4 C)     Temp Source 02/06/15 1511 Tympanic     SpO2 02/06/15 1511 99 %     Weight 02/06/15 1511 250 lb (113.399 kg)     Height 02/06/15 1511 5' 10.5" (1.791 m)     Head Cir --      Peak Flow --      Pain Score 02/06/15 1515 6     Pain Loc --  Pain Edu? --      Excl. in GC? --     Constitutional: Alert and oriented. Well appearing and in no acute distress. Eyes: Conjunctivae are normal. PERRL. EOMI. Head: ColumbusDryCleaner.frAtraumatic.mild to moderate tenderness to palpation bilateral frontal and mild TTP bilateral maxillary sinuses. No swelling. No erythema.  Ears: no erythema, normal TMs bilaterally.   Nose:Nasal congestion with bilateral nasal turbinate erythema and edema.  Mouth/Throat: Mucous membranes are moist.  Oropharynx non-erythematous.Tonsils surgically absent.  Neck: No stridor.  No cervical spine tenderness to palpation. Hematological/Lymphatic/Immunilogical: No cervical lymphadenopathy. Cardiovascular: Normal rate, regular rhythm. Grossly normal heart sounds.  Good peripheral circulation. Respiratory: Normal respiratory effort.  No retractions. Lungs CTAB. No wheezes, rales or  rhonchi. Good air movement.  Gastrointestinal: Soft and nontender.  Normal Bowel sounds.   Musculoskeletal: No lower or upper extremity tenderness nor edema. Bilateral pedal pulses equal and easily palpated. No cervical, thoracic or lumbar tenderness patient.  Neurologic:  Normal speech and language. No gross focal neurologic deficits are appreciated. No gait instability. Skin:  Skin is warm, dry and intact. No rash noted. Psychiatric: Mood and affect are normal. Speech and behavior are normal.  ____________________________________________   INITIAL IMPRESSION / ASSESSMENT AND PLAN / ED COURSE  Pertinent labs & imaging results that were available during my care of the patient were reviewed by me and considered in my medical decision making (see chart for details).  Very well-appearing patient. No acute distress. Presents for complaints of to 3 days of runny nose, nasal congestion, sinus pain, sinus pressure and sinus drainage. Reports intermittent cough as well as sore throat. Lungs clear throughout. Abdomen soft and nontender. Very well-appearing patient. Suspect viral upper respiratory infection. Patient expresses concern that his primary care physician would not be open this weekend. Counseled regarding supportive treatments. We'll treats supportively with Claritin-D as well as when necessary guaifenesin with codeine. Prescription for doxycycline given and directed patient to use supportive treatments first and if no improvement in 3 days then initiate antibiotic therapy. Patient verbalized understanding of this and agreed to the plan.  Discussed follow up with Primary care physician this week. Discussed follow up and return parameters including no resolution or any worsening concerns. Patient verbalized understanding and agreed to plan.   ____________________________________________   FINAL CLINICAL IMPRESSION(S) / ED DIAGNOSES  Final diagnoses:  Upper respiratory infection  Acute  frontal sinusitis, recurrence not specified       Renford DillsLindsey Carmelita Amparo, NP 02/06/15 1745  Renford DillsLindsey Warwick Nick, NP 02/06/15 1745

## 2015-02-06 NOTE — ED Notes (Signed)
States started yesterday with sinus congestion and now pressure behind eyes

## 2015-06-24 ENCOUNTER — Other Ambulatory Visit: Payer: Self-pay | Admitting: Student

## 2015-06-24 DIAGNOSIS — M7581 Other shoulder lesions, right shoulder: Secondary | ICD-10-CM

## 2015-07-04 ENCOUNTER — Ambulatory Visit
Admission: EM | Admit: 2015-07-04 | Discharge: 2015-07-04 | Disposition: A | Discharge status: Home / Self Care | Payer: 59 | Attending: Family Medicine | Admitting: Family Medicine

## 2015-07-04 ENCOUNTER — Encounter: Payer: Self-pay | Admitting: Emergency Medicine

## 2015-07-04 DIAGNOSIS — R059 Cough, unspecified: Secondary | ICD-10-CM

## 2015-07-04 DIAGNOSIS — R05 Cough: Secondary | ICD-10-CM | POA: Diagnosis not present

## 2015-07-04 HISTORY — DX: Type 2 diabetes mellitus without complications: E11.9

## 2015-07-04 MED ORDER — ALBUTEROL SULFATE HFA 108 (90 BASE) MCG/ACT IN AERS: 1.0000 | INHALATION_SPRAY | Freq: Four times a day (QID) | RESPIRATORY_TRACT | Status: DC | PRN

## 2015-07-04 MED ORDER — HYDROCOD POLST-CPM POLST ER 10-8 MG/5ML PO SUER: ORAL | Status: DC

## 2015-07-04 NOTE — ED Notes (Signed)
Patient c/o cough and chest congestion for a week.  Patient denies fevers.  °

## 2015-07-15 ENCOUNTER — Ambulatory Visit
Admission: RE | Admit: 2015-07-15 | Discharge: 2015-07-15 | Disposition: A | Discharge status: Home / Self Care | Payer: 59 | Source: Ambulatory Visit | Attending: Student | Admitting: Student

## 2015-07-15 ENCOUNTER — Ambulatory Visit: Payer: 59

## 2015-07-15 DIAGNOSIS — M7581 Other shoulder lesions, right shoulder: Secondary | ICD-10-CM

## 2015-07-15 DIAGNOSIS — M75121 Complete rotator cuff tear or rupture of right shoulder, not specified as traumatic: Secondary | ICD-10-CM | POA: Insufficient documentation

## 2015-07-17 ENCOUNTER — Ambulatory Visit: Payer: 59 | Attending: Neurology

## 2015-07-17 DIAGNOSIS — G4733 Obstructive sleep apnea (adult) (pediatric): Secondary | ICD-10-CM | POA: Diagnosis present

## 2015-08-06 NOTE — ED Provider Notes (Signed)
CSN: [id number]     Arrival date & time 07/04/15  1029 History   First MD Initiated Contact with Patient 07/04/15 1117     Chief Complaint  Patient presents with  . Cough   (Consider location/radiation/quality/duration/timing/severity/associated sxs/prior Treatment) Patient is a 60 y.o. male presenting with cough and URI. The history is provided by the patient.  Cough Associated symptoms: rhinorrhea   Associated symptoms: no fever and no headaches   URI Presenting symptoms: congestion, cough and rhinorrhea   Presenting symptoms: no fever   Severity:  Moderate Onset quality:  Sudden Duration:  5 days Timing:  Constant Progression:  Unchanged Chronicity:  New Relieved by:  Nothing Ineffective treatments:  OTC medications Associated symptoms: no headaches and no sinus pain   Risk factors: diabetes mellitus   Risk factors: not elderly, no chronic cardiac disease, no chronic kidney disease, no chronic respiratory disease, no immunosuppression, no recent illness, no recent travel and no sick contacts     Past Medical History  Diagnosis Date  . Hypercholesteremia   . Hypercholesteremia   . Neuropathy (HCC)   . Diabetes mellitus without complication [facility])    Past Surgical History  Procedure Laterality Date  . Total knee arthroplasty    . Joint replacement    . Tonsillectomy     History reviewed. No pertinent family history. Social History  Substance Use Topics  . Smoking status: Former Games developer  . Smokeless tobacco: Never Used     Comment: quit  . Alcohol Use: Yes     Comment: socially    Review of Systems  Constitutional: Negative for fever.  HENT: Positive for congestion and rhinorrhea.   Respiratory: Positive for cough.   Neurological: Negative for headaches.    Allergies  Review of patient's allergies indicates no known allergies.  Home Medications   Prior to Admission medications   Medication Sig Start Date End Date Taking? Authorizing Provider  metFORMIN  (GLUCOPHAGE-XR) 750 MG 24 hr tablet Take 750 mg by mouth daily with breakfast.   Yes Historical Provider, MD  albuterol (PROVENTIL HFA;VENTOLIN HFA) 108 (90 Base) MCG/ACT inhaler Inhale 1-2 puffs into the lungs every 6 (six) hours as needed for wheezing or shortness of breath. 07/04/15   Payton Mccallum, MD  atorvastatin (LIPITOR) 40 MG tablet  06/06/14   Historical Provider, MD  azithromycin (ZITHROMAX) 250 MG tablet Take 1 tablet (250 mg total) by mouth daily. Take first 2 tablets together, then 1 every day until finished. 07/21/14   Barbaraann Barthel, NP  benzonatate (TESSALON) 100 MG capsule Take 1 capsule (100 mg total) by mouth 3 (three) times daily as needed for cough. 07/21/14   Barbaraann Barthel, NP  chlorpheniramine-HYDROcodone (TUSSIONEX PENNKINETIC ER) 10-8 MG/5ML SUER 5ml po qhs prn cough 07/04/15   Payton Mccallum, MD  doxycycline (VIBRAMYCIN) 100 MG capsule Take 1 capsule (100 mg total) by mouth 2 (two) times daily. 02/06/15   Renford Dills, NP  gabapentin (NEURONTIN) 300 MG capsule Take 300 mg by mouth 3 (three) times daily.    Historical Provider, MD  guaiFENesin-codeine 100-10 MG/5ML syrup Take 5 mLs by mouth 3 (three) times daily as needed for cough. 02/06/15   Renford Dills, NP  pseudoephedrine (SUDAFED) 30 MG tablet Take 1 tablet (30 mg total) by mouth every 4 (four) hours as needed for congestion (max 8 tabs in 24 hours ( )). 07/21/14   Barbaraann Barthel, NP   Meds Ordered and Administered this Visit  Medications - No data to display  BP 131/75 mmHg  Pulse 60  Temp(Src) 98 F (36.7 C) (Oral)  Resp 17  Ht 5\' 10"  (1.778 m)  Wt 250 lb (113.399 kg)  BMI 35.87 kg/m2  SpO2 97% No data found.   Physical Exam  Constitutional: He appears well-developed and well-nourished. No distress.  HENT:  Head: Normocephalic and atraumatic.  Right Ear: Tympanic membrane, external ear and ear canal normal.  Left Ear: Tympanic membrane, external ear and ear canal normal.  Nose: Nose normal.   Mouth/Throat: Uvula is midline, oropharynx is clear and moist and mucous membranes are normal. No oropharyngeal exudate or tonsillar abscesses.  Eyes: Conjunctivae and EOM are normal. Pupils are equal, round, and reactive to light. Right eye exhibits no discharge. Left eye exhibits no discharge. No scleral icterus.  Neck: Normal range of motion. Neck supple. No tracheal deviation present. No thyromegaly present.  Cardiovascular: Normal rate, regular rhythm and normal heart sounds.   Pulmonary/Chest: Effort normal and breath sounds normal. No stridor. No respiratory distress. He has no wheezes. He has no rales. He exhibits no tenderness.  Lymphadenopathy:    He has no cervical adenopathy.  Neurological: He is alert.  Skin: Skin is warm and dry. No rash noted. He is not diaphoretic.  Nursing note and vitals reviewed.   ED Course  Procedures (including critical care time)  Labs Review Labs Reviewed - No data to display  Imaging Review No results found.   Visual Acuity Review  Right Eye Distance:   Left Eye Distance:   Bilateral Distance:    Right Eye Near:   Left Eye Near:    Bilateral Near:         MDM   1. Cough    Discharge Medication List as of 07/04/2015 11:36 AM    START taking these medications   Details  chlorpheniramine-HYDROcodone (TUSSIONEX PENNKINETIC ER) 10-8 MG/5ML SUER 5ml po qhs prn cough, Normal       1. diagnosis reviewed with patient 2. rx as per orders above; reviewed possible side effects, interactions, risks and benefits  3. Recommend supportive treatment with increased fluids 4. Follow-up prn if symptoms worsen or don't improve    Payton Mccallumrlando Anterio Scheel, MD 08/06/15 1537

## 2015-08-14 ENCOUNTER — Encounter
Admission: RE | Admit: 2015-08-14 | Discharge: 2015-08-14 | Disposition: A | Discharge status: Home / Self Care | Payer: 59 | Source: Ambulatory Visit | Attending: Surgery | Admitting: Surgery

## 2015-08-14 DIAGNOSIS — Z01812 Encounter for preprocedural laboratory examination: Secondary | ICD-10-CM | POA: Diagnosis present

## 2015-08-14 DIAGNOSIS — Z01810 Encounter for preprocedural cardiovascular examination: Secondary | ICD-10-CM | POA: Diagnosis present

## 2015-08-14 HISTORY — DX: Sleep apnea, unspecified: G47.30

## 2015-08-14 HISTORY — DX: Unspecified osteoarthritis, unspecified site: M19.90

## 2015-08-14 LAB — DIFFERENTIAL
BASOS ABS: 0 10*3/uL (ref 0–0.1)
BASOS PCT: 0 %
Eosinophils Absolute: 0.3 10*3/uL (ref 0–0.7)
Eosinophils Relative: 3 %
Lymphocytes Relative: 29 %
Lymphs Abs: 2.6 10*3/uL (ref 1.0–3.6)
Monocytes Absolute: 0.8 10*3/uL (ref 0.2–1.0)
Monocytes Relative: 9 %
NEUTROS ABS: 5.3 10*3/uL (ref 1.4–6.5)
Neutrophils Relative %: 59 %

## 2015-08-14 LAB — BASIC METABOLIC PANEL
ANION GAP: 9 (ref 5–15)
BUN: 12 mg/dL (ref 6–20)
CO2: 24 mmol/L (ref 22–32)
CREATININE: 0.77 mg/dL (ref 0.61–1.24)
Calcium: 9.2 mg/dL (ref 8.9–10.3)
Chloride: 104 mmol/L (ref 101–111)
Glucose, Bld: 137 mg/dL — ABNORMAL HIGH (ref 65–99)
Potassium: 3.7 mmol/L (ref 3.5–5.1)
Sodium: 137 mmol/L (ref 135–145)

## 2015-08-14 LAB — CBC
HCT: 41.2 % (ref 40.0–52.0)
Hemoglobin: 14.1 g/dL (ref 13.0–18.0)
MCH: 27.5 pg (ref 26.0–34.0)
MCHC: 34.3 g/dL (ref 32.0–36.0)
MCV: 80.3 fL (ref 80.0–100.0)
PLATELETS: 223 10*3/uL (ref 150–440)
RBC: 5.13 MIL/uL (ref 4.40–5.90)
RDW: 14.8 % — AB (ref 11.5–14.5)
WBC: 8.9 10*3/uL (ref 3.8–10.6)

## 2015-08-14 NOTE — Patient Instructions (Signed)
  Your procedure is scheduled on:August 26, 2015 (Tuesday) Report to Day Surgery. [facility]LO[facility] To find out your arrival time please call [phone] between 1PM - 3PM on August 25, 2015 (Monday).  Remember: Instructions that are not followed completely may result in serious medical risk, up to and including death, or upon the discretion of your surgeon and anesthesiologist your surgery may need to be rescheduled.    __x__ 1. Do not eat food or drink liquids after midnight. No gum chewing or hard candies.     __x__ 2. No Alcohol for 24 hours before or after surgery.   __x__ 3. Do Not Smoke For 24 Hours Prior to Your Surgery.   ____ 4. Bring all medications with you on the day of surgery if instructed.    __x__ 5. Notify your doctor if there is any change in your medical condition     (cold, fever, infections).       Do not wear jewelry, make-up, hairpins, clips or nail polish.  Do not wear lotions, powders, or perfumes. You may wear deodorant.  Do not shave 48 hours prior to surgery. Men may shave face and neck.  Do not bring valuables to the hospital.    [facility] for any belongings or valuables.               Contacts, dentures or bridgework may not be worn into surgery.  Leave your suitcase in the car. After surgery it may be brought to your room.  For patients admitted to the hospital, discharge time is determined by your                treatment team.   Patients discharged the day of surgery will not be allowed to drive home.   Please read over the following fact sheets that you were given:   Surgical Site Infection Prevention   ____ Take these medicines the morning of surgery with A SIP OF WATER:    1.   2.   3.   4.  5.  6.  ____ Fleet Enema (as directed)   _x___ Use CHG Soap as directed  _x___ Use inhalers on the day of surgery (Use Albuterol inhaler the morning of surgery and bring with you to hospital)  _x___ Stop metformin 2  days prior to surgery (Stop Metformin on July 9)    ____ Take 1/2 of usual insulin dose the night before surgery and none on the morning of surgery.   __x__ Stop Coumadin/Plavix/aspirin on (NO ASPIRIN)  __x__ Stop Anti-inflammatories on (Stop Etodolac ten days prior to surgery) NO NSAIDS, Tylenol or Tramadol ok to take for pain if needed   ____ Stop supplements until after surgery.    __x__ Bring C-Pap to the hospital.

## 2015-08-17 DIAGNOSIS — M75121 Complete rotator cuff tear or rupture of right shoulder, not specified as traumatic: Secondary | ICD-10-CM | POA: Insufficient documentation

## 2015-08-26 ENCOUNTER — Encounter: Payer: Self-pay | Admitting: Anesthesiology

## 2015-08-26 ENCOUNTER — Encounter
Admission: RE | Disposition: A | Discharge status: Home / Self Care | Payer: Self-pay | Source: Ambulatory Visit | Attending: Surgery

## 2015-08-26 ENCOUNTER — Ambulatory Visit
Admission: RE | Admit: 2015-08-26 | Discharge: 2015-08-26 | Disposition: A | Discharge status: Home / Self Care | Payer: 59 | Source: Ambulatory Visit | Attending: Surgery | Admitting: Surgery

## 2015-08-26 ENCOUNTER — Ambulatory Visit: Payer: 59 | Admitting: Anesthesiology

## 2015-08-26 DIAGNOSIS — M7521 Bicipital tendinitis, right shoulder: Secondary | ICD-10-CM | POA: Insufficient documentation

## 2015-08-26 DIAGNOSIS — Z9889 Other specified postprocedural states: Secondary | ICD-10-CM | POA: Diagnosis not present

## 2015-08-26 DIAGNOSIS — M75121 Complete rotator cuff tear or rupture of right shoulder, not specified as traumatic: Secondary | ICD-10-CM | POA: Insufficient documentation

## 2015-08-26 DIAGNOSIS — G4730 Sleep apnea, unspecified: Secondary | ICD-10-CM | POA: Diagnosis not present

## 2015-08-26 DIAGNOSIS — G47411 Narcolepsy with cataplexy: Secondary | ICD-10-CM | POA: Diagnosis not present

## 2015-08-26 DIAGNOSIS — M7541 Impingement syndrome of right shoulder: Secondary | ICD-10-CM | POA: Insufficient documentation

## 2015-08-26 DIAGNOSIS — Z79899 Other long term (current) drug therapy: Secondary | ICD-10-CM | POA: Insufficient documentation

## 2015-08-26 DIAGNOSIS — Z818 Family history of other mental and behavioral disorders: Secondary | ICD-10-CM | POA: Diagnosis not present

## 2015-08-26 DIAGNOSIS — Z823 Family history of stroke: Secondary | ICD-10-CM | POA: Insufficient documentation

## 2015-08-26 DIAGNOSIS — Z791 Long term (current) use of non-steroidal anti-inflammatories (NSAID): Secondary | ICD-10-CM | POA: Diagnosis not present

## 2015-08-26 DIAGNOSIS — Z833 Family history of diabetes mellitus: Secondary | ICD-10-CM | POA: Diagnosis not present

## 2015-08-26 HISTORY — PX: SHOULDER ARTHROSCOPY WITH OPEN ROTATOR CUFF REPAIR: SHX6092

## 2015-08-26 HISTORY — PX: BICEPT TENODESIS: SHX5116

## 2015-08-26 HISTORY — PX: SHOULDER ARTHROSCOPY WITH SUBACROMIAL DECOMPRESSION: SHX5684

## 2015-08-26 HISTORY — PX: SHOULDER ARTHROSCOPY WITH DEBRIDEMENT AND BICEP TENDON REPAIR: SHX5690

## 2015-08-26 LAB — GLUCOSE, CAPILLARY
Glucose-Capillary: 122 mg/dL — ABNORMAL HIGH (ref 65–99)
Glucose-Capillary: 152 mg/dL — ABNORMAL HIGH (ref 65–99)

## 2015-08-26 SURGERY — SHOULDER ARTHROSCOPY WITH DEBRIDEMENT AND BICEP TENDON REPAIR
Anesthesia: General | Laterality: Right

## 2015-08-26 MED ORDER — HYDROMORPHONE HCL 1 MG/ML IJ SOLN
0.2500 mg | INTRAMUSCULAR | Status: DC | PRN
  Administered 2015-08-26 (×4): 0.5 mg via INTRAVENOUS

## 2015-08-26 MED ORDER — HYDROMORPHONE HCL 1 MG/ML IJ SOLN
INTRAMUSCULAR | Status: AC
  Filled 2015-08-26: qty 1

## 2015-08-26 MED ORDER — ONDANSETRON HCL 4 MG/2ML IJ SOLN
INTRAMUSCULAR | Status: AC
  Filled 2015-08-26: qty 2

## 2015-08-26 MED ORDER — HYDRALAZINE HCL 20 MG/ML IJ SOLN
10.0000 mg | Freq: Once | INTRAMUSCULAR | Status: AC
  Administered 2015-08-26: 10 mg via INTRAVENOUS

## 2015-08-26 MED ORDER — METOCLOPRAMIDE HCL 5 MG/ML IJ SOLN: 5.0000 mg | Freq: Three times a day (TID) | INTRAMUSCULAR | Status: DC | PRN

## 2015-08-26 MED ORDER — EPINEPHRINE HCL 1 MG/ML IJ SOLN
INTRAMUSCULAR | Status: DC | PRN
  Administered 2015-08-26: 2 mg via INTRAMUSCULAR

## 2015-08-26 MED ORDER — LIDOCAINE HCL (CARDIAC) 20 MG/ML IV SOLN
INTRAVENOUS | Status: DC | PRN
  Administered 2015-08-26: 50 mg via INTRAVENOUS

## 2015-08-26 MED ORDER — OXYCODONE HCL 5 MG PO TABS: 5.0000 mg | ORAL_TABLET | ORAL | Status: DC | PRN

## 2015-08-26 MED ORDER — ONDANSETRON HCL 4 MG/2ML IJ SOLN
INTRAMUSCULAR | Status: DC | PRN
  Administered 2015-08-26: 4 mg via INTRAVENOUS

## 2015-08-26 MED ORDER — FENTANYL CITRATE (PF) 100 MCG/2ML IJ SOLN
INTRAMUSCULAR | Status: DC | PRN
  Administered 2015-08-26 (×5): 50 ug via INTRAVENOUS

## 2015-08-26 MED ORDER — KETOROLAC TROMETHAMINE 30 MG/ML IJ SOLN
INTRAMUSCULAR | Status: AC
  Administered 2015-08-26: 30 mg via INTRAVENOUS
  Filled 2015-08-26: qty 1

## 2015-08-26 MED ORDER — ROPIVACAINE HCL 5 MG/ML IJ SOLN
INTRAMUSCULAR | Status: DC | PRN
  Administered 2015-08-26 (×3): 10 mL via EPIDURAL

## 2015-08-26 MED ORDER — SUGAMMADEX SODIUM 200 MG/2ML IV SOLN
INTRAVENOUS | Status: DC | PRN
  Administered 2015-08-26: 225 mg via INTRAVENOUS

## 2015-08-26 MED ORDER — KETAMINE HCL 50 MG/ML IJ SOLN
INTRAMUSCULAR | Status: DC | PRN
  Administered 2015-08-26 (×2): 25 mg via INTRAVENOUS

## 2015-08-26 MED ORDER — METOCLOPRAMIDE HCL 10 MG PO TABS: 5.0000 mg | ORAL_TABLET | Freq: Three times a day (TID) | ORAL | Status: DC | PRN

## 2015-08-26 MED ORDER — PROPOFOL 10 MG/ML IV BOLUS
INTRAVENOUS | Status: DC | PRN
  Administered 2015-08-26: 200 mg via INTRAVENOUS
  Administered 2015-08-26: 100 mg via INTRAVENOUS

## 2015-08-26 MED ORDER — CEFAZOLIN SODIUM-DEXTROSE 2-4 GM/100ML-% IV SOLN
2.0000 g | Freq: Once | INTRAVENOUS | Status: AC
  Administered 2015-08-26: 2 g via INTRAVENOUS

## 2015-08-26 MED ORDER — ROCURONIUM BROMIDE 100 MG/10ML IV SOLN
INTRAVENOUS | Status: DC | PRN
  Administered 2015-08-26: 50 mg via INTRAVENOUS
  Administered 2015-08-26: 20 mg via INTRAVENOUS

## 2015-08-26 MED ORDER — POTASSIUM CHLORIDE IN NACL 20-0.9 MEQ/L-% IV SOLN
INTRAVENOUS | Status: DC
Start: 2015-08-26 — End: 2015-08-26
  Filled 2015-08-26: qty 1000

## 2015-08-26 MED ORDER — HYDRALAZINE HCL 20 MG/ML IJ SOLN
INTRAMUSCULAR | Status: AC
  Filled 2015-08-26: qty 1

## 2015-08-26 MED ORDER — KETOROLAC TROMETHAMINE 30 MG/ML IJ SOLN
30.0000 mg | Freq: Once | INTRAMUSCULAR | Status: AC
  Administered 2015-08-26: 30 mg via INTRAVENOUS

## 2015-08-26 MED ORDER — SODIUM CHLORIDE 0.9 % IV SOLN
INTRAVENOUS | Status: DC
  Administered 2015-08-26: 10:00:00 via INTRAVENOUS

## 2015-08-26 MED ORDER — FENTANYL CITRATE (PF) 100 MCG/2ML IJ SOLN
25.0000 ug | INTRAMUSCULAR | Status: DC | PRN
  Administered 2015-08-26 (×4): 25 ug via INTRAVENOUS

## 2015-08-26 MED ORDER — GLYCOPYRROLATE 0.2 MG/ML IJ SOLN
INTRAMUSCULAR | Status: DC | PRN
  Administered 2015-08-26: 0.2 mg via INTRAVENOUS

## 2015-08-26 MED ORDER — FENTANYL CITRATE (PF) 100 MCG/2ML IJ SOLN
INTRAMUSCULAR | Status: AC
  Filled 2015-08-26: qty 2

## 2015-08-26 MED ORDER — DEXAMETHASONE SODIUM PHOSPHATE 10 MG/ML IJ SOLN
INTRAMUSCULAR | Status: DC | PRN
  Administered 2015-08-26: 10 mg via INTRAVENOUS

## 2015-08-26 MED ORDER — ONDANSETRON HCL 4 MG PO TABS: 4.0000 mg | ORAL_TABLET | Freq: Four times a day (QID) | ORAL | Status: DC | PRN

## 2015-08-26 MED ORDER — ONDANSETRON HCL 4 MG/2ML IJ SOLN: 4.0000 mg | Freq: Four times a day (QID) | INTRAMUSCULAR | Status: DC | PRN

## 2015-08-26 MED ORDER — MIDAZOLAM HCL 2 MG/2ML IJ SOLN
INTRAMUSCULAR | Status: DC | PRN
  Administered 2015-08-26: 2 mg via INTRAVENOUS

## 2015-08-26 MED ORDER — LIDOCAINE HCL (PF) 2 % IJ SOLN
INTRAMUSCULAR | Status: DC | PRN
  Administered 2015-08-26: 3 mL via INTRADERMAL

## 2015-08-26 MED ORDER — ONDANSETRON HCL 4 MG/2ML IJ SOLN
4.0000 mg | Freq: Once | INTRAMUSCULAR | Status: AC | PRN
  Administered 2015-08-26: 4 mg via INTRAVENOUS

## 2015-08-26 MED ORDER — FAMOTIDINE 20 MG PO TABS
20.0000 mg | ORAL_TABLET | Freq: Once | ORAL | Status: AC
  Administered 2015-08-26: 20 mg via ORAL

## 2015-08-26 MED ORDER — BUPIVACAINE-EPINEPHRINE 0.5% -1:200000 IJ SOLN
INTRAMUSCULAR | Status: DC | PRN
Start: 2015-08-26 — End: 2015-08-26
  Administered 2015-08-26: 30 mL

## 2015-08-26 SURGICAL SUPPLY — 46 items
ANCHOR JUGGERKNOT WTAP NDL 2.9 (Anchor) ×8 IMPLANT
ANCHOR SUT QUATTRO KNTLS 4.5 (Anchor) ×2 IMPLANT
ANCHOR SUT W/ ORTHOCORD (Anchor) ×2 IMPLANT
BIT DRILL JUGRKNT W/NDL BIT2.9 (DRILL) ×1 IMPLANT
BLADE FULL RADIUS 3.5 (BLADE) IMPLANT
BLADE SHAVER 4.5X7 STR FR (MISCELLANEOUS) ×2 IMPLANT
BUR ACROMIONIZER 4.0 (BURR) ×2 IMPLANT
BUR BR 5.5 WIDE MOUTH (BURR) IMPLANT
CANNULA SHAVER 8MMX76MM (CANNULA) ×2 IMPLANT
CHLORAPREP W/TINT 26ML (MISCELLANEOUS) ×2 IMPLANT
COVER MAYO STAND STRL (DRAPES) ×2 IMPLANT
DRAPE IMP U-DRAPE 54X76 (DRAPES) ×4 IMPLANT
DRILL JUGGERKNOT W/NDL BIT 2.9 (DRILL) ×2
DRSG OPSITE POSTOP 4X8 (GAUZE/BANDAGES/DRESSINGS) IMPLANT
ELECT REM PT RETURN 9FT ADLT (ELECTROSURGICAL) ×2
ELECTRODE REM PT RTRN 9FT ADLT (ELECTROSURGICAL) ×1 IMPLANT
GAUZE PETRO XEROFOAM 1X8 (MISCELLANEOUS) ×2 IMPLANT
GAUZE SPONGE 4X4 12PLY STRL (GAUZE/BANDAGES/DRESSINGS) ×2 IMPLANT
GLOVE BIO SURGEON STRL SZ7.5 (GLOVE) ×4 IMPLANT
GLOVE BIO SURGEON STRL SZ8 (GLOVE) ×4 IMPLANT
GLOVE BIOGEL PI IND STRL 8 (GLOVE) ×1 IMPLANT
GLOVE BIOGEL PI INDICATOR 8 (GLOVE) ×1
GLOVE INDICATOR 8.0 STRL GRN (GLOVE) ×2 IMPLANT
GOWN STRL REUS W/ TWL LRG LVL3 (GOWN DISPOSABLE) ×2 IMPLANT
GOWN STRL REUS W/ TWL XL LVL3 (GOWN DISPOSABLE) ×1 IMPLANT
GOWN STRL REUS W/TWL LRG LVL3 (GOWN DISPOSABLE) ×2
GOWN STRL REUS W/TWL XL LVL3 (GOWN DISPOSABLE) ×1
GRASPER SUT 15 45D LOW PRO (SUTURE) ×2 IMPLANT
IV LACTATED RINGER IRRG 3000ML (IV SOLUTION) ×2
IV LR IRRIG 3000ML ARTHROMATIC (IV SOLUTION) ×2 IMPLANT
MANIFOLD NEPTUNE II (INSTRUMENTS) ×2 IMPLANT
MASK FACE SPIDER DISP (MASK) ×2 IMPLANT
MAT BLUE FLOOR 46X72 FLO (MISCELLANEOUS) ×2 IMPLANT
NEEDLE REVERSE CUT 1/2 CRC (NEEDLE) IMPLANT
PACK ARTHROSCOPY SHOULDER (MISCELLANEOUS) ×2 IMPLANT
SLING ARM LRG DEEP (SOFTGOODS) ×2 IMPLANT
SLING ULTRA II LG (MISCELLANEOUS) ×2 IMPLANT
STAPLER SKIN PROX 35W (STAPLE) ×2 IMPLANT
STRAP SAFETY BODY (MISCELLANEOUS) ×2 IMPLANT
SUT ETHIBOND 0 MO6 C/R (SUTURE) ×2 IMPLANT
SUT VIC AB 2-0 CT1 27 (SUTURE) ×2
SUT VIC AB 2-0 CT1 TAPERPNT 27 (SUTURE) ×2 IMPLANT
TAPE MICROFOAM 4IN (TAPE) ×2 IMPLANT
TUBING ARTHRO INFLOW-ONLY STRL (TUBING) ×2 IMPLANT
TUBING CONNECTING 10 (TUBING) ×2 IMPLANT
WAND HAND CNTRL MULTIVAC 90 (MISCELLANEOUS) ×2 IMPLANT

## 2015-08-26 NOTE — Anesthesia Preprocedure Evaluation (Signed)
Anesthesia Evaluation  Patient identified by MRN, date of birth, ID band Patient awake    Reviewed: Allergy & Precautions, NPO status , Patient's Chart, lab work & pertinent test results  Airway Mallampati: II  TM Distance: >3 FB    Comment: Large neck Dental  (+) Chipped   Pulmonary sleep apnea and Continuous Positive Airway Pressure Ventilation , former smoker,    Pulmonary exam normal        Cardiovascular negative cardio ROS Normal cardiovascular exam     Neuro/Psych Peripheral neuropathy, mostly in feet  Neuromuscular disease negative psych ROS   GI/Hepatic negative GI ROS, Neg liver ROS,   Endo/Other  diabetes, Well Controlled, Type 2, Oral Hypoglycemic Agents  Renal/GU negative Renal ROS  negative genitourinary   Musculoskeletal  (+) Arthritis , Osteoarthritis,    Abdominal Normal abdominal exam  (+)   Peds negative pediatric ROS (+)  Hematology negative hematology ROS (+)   Anesthesia Other Findings   Reproductive/Obstetrics                             Anesthesia Physical Anesthesia Plan  ASA: III  Anesthesia Plan: General   Post-op Pain Management:    Induction: Intravenous  Airway Management Planned: Oral ETT  Additional Equipment:   Intra-op Plan:   Post-operative Plan: Extubation in OR  Informed Consent: I have reviewed the patients History and Physical, chart, labs and discussed the procedure including the risks, benefits and alternatives for the proposed anesthesia with the patient or authorized representative who has indicated his/her understanding and acceptance.   Dental advisory given  Plan Discussed with: CRNA and Surgeon  Anesthesia Plan Comments: (Talked with patient at length.  We will forgo the preop interscalene block and concentrate on the GOT with end of the case local infiltration by the surgeon.  The patient has significant sleep apnea and URI  3  weeks ago.  WE decided to see how the patient fares in the PACU with pain control and , if needed, we will perform an interscalene block in PACU to control intractable pain.  Patient understands risks and benefits and agrees to this route.  All parties understand that he may still be required to stay overnight)        Anesthesia Quick Evaluation

## 2015-08-26 NOTE — H&P (Signed)
Paper H&P to be scanned into permanent record. H&P reviewed. No changes. 

## 2015-08-26 NOTE — Transfer of Care (Signed)
Immediate Anesthesia Transfer of Care Note  Patient: Louis Jensen  Procedure(s) Performed: Procedure(s): SHOULDER ARTHROSCOPY WITH DEBRIDEMENT  (Right) SHOULDER ARTHROSCOPY WITH OPEN ROTATOR CUFF REPAIR (Right) SHOULDER ARTHROSCOPY WITH SUBACROMIAL DECOMPRESSION (Right) BICEPS TENODESIS (Right)  Patient Location: PACU  Anesthesia Type:General  Level of Consciousness: awake and alert   Airway & Oxygen Therapy: Patient Spontanous Breathing and Patient connected to face mask oxygen  Post-op Assessment: Report given to RN and Post -op Vital signs reviewed and stable  Post vital signs: Reviewed  Last Vitals:  Filed Vitals:   08/26/15 1230 08/26/15 1231  BP: 187/104 187/104  Pulse: 73 67  Temp: 36.2 C 36.2 C  Resp: 16 16    Last Pain:  Filed Vitals:   08/26/15 1234  PainSc: 6          Complications: No apparent anesthesia complications

## 2015-08-26 NOTE — Discharge Instructions (Addendum)
Keep dressing dry and intact.  °May shower after dressing changed on post-op day #4 (Saturday).  °Cover staples with Band-Aids after drying off. °Apply ice frequently to shoulder. °Keep shoulder immobilizer on at all times except may remove for bathing purposes.Follow-up in 10-14 days or as scheduled ° ° ° °.AMBULATORY SURGERY  °DISCHARGE INSTRUCTIONS ° ° °1) The drugs that you were given will stay in your system until tomorrow so for the next 24 hours you should not: ° °A) Drive an automobile °B) Make any legal decisions °C) Drink any alcoholic beverage ° ° °2) You may resume regular meals tomorrow.  Today it is better to start with liquids and gradually work up to solid foods. ° °You may eat anything you prefer, but it is better to start with liquids, then soup and crackers, and gradually work up to solid foods. ° ° °3) Please notify your doctor immediately if you have any unusual bleeding, trouble breathing, redness and pain at the surgery site, drainage, fever, or pain not relieved by medication. °4)  ° °5) Your post-operative visit with Dr.                     °           °     is: Date:                        Time:   ° °Please call to schedule your post-operative visit. ° °6) Additional Instructions: ° °

## 2015-08-26 NOTE — Op Note (Signed)
08/26/2015  12:17 PM  Patient:   Louis Jensen  Pre-Op Diagnosis:   Impingement/tendinopathy with rotator cuff tear and biceps tendinopathy, right shoulder.  Postoperative diagnosis: Impingement/tendinopathy with rotator cuff tear and biceps tendinopathy, right shoulder.  Procedure: Limited arthroscopic debridement, arthroscopic subscapularis tendon repair, arthroscopic subacromial decompression, mini-open rotator cuff repair, and mini-open biceps tenodesis, right shoulder.  Anesthesia: General endotracheal with interscalene block placed preoperatively by the anesthesiologist.  Surgeon:   Maryagnes Amos, MD  Assistant:   Horris Latino, PA-C  Findings: As above. The labrum demonstrated some mild fraying superiorly and anteriorly but otherwise was intact. The biceps tendon had subluxed out of the bicipital groove into the torn portion of the subscapularis tendon. There was a full-thickness tear involving the anterior insertional fibers of the supraspinatus tendon measuring approximately 1 x 2 cm. The articular surfaces of the glenoid and humerus both were in satisfactory condition.  Complications: None  Fluids:   700 cc  Estimated blood loss: 10 cc  Tourniquet time: None  Drains: None  Closure: Staples   Brief clinical note: The patient is a 60 year old male with a history of right shoulder pain. The patient's symptoms have progressed despite medications, activity modification, etc. The patient's history and examination are consistent with impingement/tendinopathy with a rotator cuff tear. These findings were confirmed by MRI scan. The MRI scan also demonstrated medial subluxation of the biceps tendon with tearing of the superior portion of the subscapularis tendon. The patient presents at this time for definitive management of these shoulder symptoms.  Procedure: The patient was brought into the operating room and lain in the supine position. The patient  then underwent general endotracheal intubation and anesthesia before being repositioned in the beach chair position using the beach chair positioner. The right shoulder and upper extremity were prepped with ChloraPrep solution before being draped sterilely. Preoperative antibiotics were administered. A timeout was performed to confirm the proper surgical site before the expected portal sites and incision site were injected with 0.5% Sensorcaine with epinephrine. A posterior portal was created and the glenohumeral joint thoroughly inspected with the findings as described above. An anterior portal was created using an outside-in technique. The labrum and rotator cuff were further probed, again confirming the above-noted findings. The areas of labral fraying were debrided using the full-radius resector, as were several areas of reactive synovitis. The long head of the biceps tendon was released from its labral anchor using the ArthroCare wand before the wand was used to contour the labrum. A separate superolateral portal was created using an outside in technique to assist with repair of the superior portion of the subscapularis tendon. This was accomplished using a single Mitek BioKnotless anchor placed in the superior portion of the lesser tuberosity. The adequacy of the repair was verified by gentle external rotation of the shoulder. The ArthroCare wand was re-inserted and used to obtain hemostasis as well as to "anneal" the labrum superiorly and anteriorly. The instruments were removed from the joint after suctioning the excess fluid.  The camera was repositioned through the posterior portal into the subacromial space. A separate lateral portal was created using an outside-in technique. The 3.5 mm full-radius resector was introduced and used to perform a subtotal bursectomy. The ArthroCare wand was then inserted and used to remove the periosteal tissue off the undersurface of the anterior third of the acromion as  well as to recess the coracoacromial ligament from its attachment along the anterior and lateral margins of the acromion. The 4.0  mm acromionizing bur was introduced and used to complete the decompression by removing the undersurface of the anterior third of the acromion. The full radius resector was reintroduced to remove any residual bony debris before the ArthroCare wand was reintroduced to obtain hemostasis. The instruments were then removed from the subacromial space after suctioning the excess fluid.  An approximately 4-5 cm incision was made over the anterolateral aspect of the shoulder beginning at the anterolateral corner of the acromion and extending distally in line with the bicipital groove. This incision was carried down through the subcutaneous tissues to expose the deltoid fascia. The raphae between the anterior and middle thirds was identified and this plane developed to provide access into the subacromial space. Additional bursal tissues were debrided sharply using Metzenbaum scissors. The rotator cuff tear was readily identified. The margins were debrided sharply with a #15 blade and the exposed greater tuberosity roughened with a rongeur. The tear was repaired using two Biomet 2.9 mm JuggerKnot anchors. Several of these sutures were then brought back laterally and secured using a [place] QuattroLink anchor to create a two-layer closure. An apparent watertight closure was obtained. In addition, a third Biomet 2.9 juggernaut anchors placed in the lesser tuberosity reinforce the subscapularis tendon repair. Both sets of sutures were brought out through the tendon and tied securely.  The bicipital groove was identified by palpation and opened for 1-1.5 cm. The biceps tendon stump was retrieved through this defect. The floor of the bicipital groove was roughened with a curet before another Biomet 2.9 mm JuggerKnot anchor was inserted. Both sets of sutures were passed through the biceps tendon and  tied securely to effect the tenodesis. The bicipital sheath was reapproximated using two #0 Ethibond interrupted sutures, incorporating the biceps tendon to further reinforce the tenodesis.  The wound was copiously irrigated with sterile saline solution before the deltoid raphae was reapproximated using 2-0 Vicryl interrupted sutures. The subcutaneous tissues were closed in two layers using 2-0 Vicryl interrupted sutures before the skin was closed using staples. The portal sites also were closed using staples. A sterile bulky dressing was applied to the shoulder before the arm was placed into a shoulder immobilizer. The patient was then awakened, extubated, and returned to the recovery room in satisfactory condition after tolerating the procedure well.

## 2015-08-26 NOTE — Anesthesia Procedure Notes (Addendum)
Procedure Name: Intubation Performed by: Mathews ArgyleLOGAN, BENJAMIN Pre-anesthesia Checklist: Patient identified, Patient being monitored, Timeout performed, Emergency Drugs available and Suction available Patient Re-evaluated:Patient Re-evaluated prior to inductionOxygen Delivery Method: Circle system utilized Preoxygenation: Pre-oxygenation with 100% oxygen Intubation Type: IV induction Ventilation: Two handed mask ventilation required and Oral airway inserted - appropriate to patient size Laryngoscope Size: Hyacinth MeekerMiller and 2 Grade View: Grade II Tube type: Oral Tube size: 7.0 mm Number of attempts: 1 Airway Equipment and Method: Stylet Placement Confirmation: ETT inserted through vocal cords under direct vision,  positive ETCO2 and breath sounds checked- equal and bilateral Secured at: 23 cm Tube secured with: Tape Dental Injury: Teeth and Oropharynx as per pre-operative assessment    Anesthesia Regional Block:  Interscalene brachial plexus block  Pre-Anesthetic Checklist: ,, timeout performed, Correct Patient, Correct Site, Correct Laterality, Correct Procedure, Correct Position, site marked, Risks and benefits discussed,  Surgical consent,  Pre-op evaluation,  At surgeon's request and post-op pain management  Laterality: Right  Prep: Maximum Sterile Barrier Precautions used and chloraprep       Needles:   Needle Type: Stimiplex     Needle Length: 9cm 9 cm Needle Gauge: 22 and 22 G    Additional Needles:  Procedures: ultrasound guided (picture in chart) and nerve stimulator Interscalene brachial plexus block  Nerve Stimulator or Paresthesia:  Response: 0.5 mA,   Additional Responses:   Narrative:  Start time: 08/26/2015 1:30 PM End time: 08/26/2015 1:40 PM Injection made incrementally with aspirations every 5 mL.  Performed by: Personally  Anesthesiologist: Yves DillARROLL, Caileb Rhue  Additional Notes: Time out performed.  A roll was place under the right side of the back to optimize the  field.  The right neck was prepped and draped in sterile fashion.  Sterile jelly was applied to the right neck.  An US probe was guided into position and the nerve bundle was visualized as a chain in a typical stoplight pattern although not quite vertical.  A 22 G stimuplex needle was advanced after a previous skin wheal with 1% Lidocaine plain.  The plexus was approached and a deltoid twitch was obtained down to 0.5 mAmps before fade.  Incremental injection of 30 cc of 0.5% Ropivacaine plain was injected easily with no resistance.  The nerve bundle moved away from the injection and patient reported no pain on injection.  The patient tolerated the procedure well and a picture was left on the chart.

## 2015-08-27 ENCOUNTER — Encounter: Payer: Self-pay | Admitting: Surgery

## 2015-08-27 NOTE — Anesthesia Postprocedure Evaluation (Signed)
Anesthesia Post Note  Patient: Chancy MilroyHarold P Niazi  Procedure(s) Performed: Procedure(s) (LRB): SHOULDER ARTHROSCOPY WITH DEBRIDEMENT  (Right) SHOULDER ARTHROSCOPY WITH OPEN ROTATOR CUFF REPAIR (Right) SHOULDER ARTHROSCOPY WITH SUBACROMIAL DECOMPRESSION (Right) BICEPS TENODESIS (Right)  Patient location during evaluation: PACU Anesthesia Type: General Level of consciousness: awake and alert and oriented Pain management: pain level controlled Vital Signs Assessment: post-procedure vital signs reviewed and stable Respiratory status: spontaneous breathing Cardiovascular status: blood pressure returned to baseline Anesthetic complications: no    Last Vitals:  Filed Vitals:   08/26/15 1516 08/26/15 1550  BP:  136/66  Pulse: 64 64  Temp:    Resp: 16 16    Last Pain:  Filed Vitals:   08/26/15 1554  PainSc: 2                  Allisa Einspahr

## 2015-08-29 DIAGNOSIS — S46101A Unspecified injury of muscle, fascia and tendon of long head of biceps, right arm, initial encounter: Secondary | ICD-10-CM | POA: Insufficient documentation

## 2015-12-31 ENCOUNTER — Other Ambulatory Visit: Payer: Self-pay

## 2015-12-31 ENCOUNTER — Encounter: Payer: Self-pay | Admitting: Emergency Medicine

## 2015-12-31 ENCOUNTER — Emergency Department
Admission: EM | Admit: 2015-12-31 | Discharge: 2015-12-31 | Disposition: A | Discharge status: Home / Self Care | Payer: 59 | Attending: Emergency Medicine | Admitting: Emergency Medicine

## 2015-12-31 ENCOUNTER — Emergency Department: Payer: 59

## 2015-12-31 DIAGNOSIS — Z7984 Long term (current) use of oral hypoglycemic drugs: Secondary | ICD-10-CM | POA: Insufficient documentation

## 2015-12-31 DIAGNOSIS — Z79899 Other long term (current) drug therapy: Secondary | ICD-10-CM | POA: Diagnosis not present

## 2015-12-31 DIAGNOSIS — M25512 Pain in left shoulder: Secondary | ICD-10-CM

## 2015-12-31 DIAGNOSIS — E119 Type 2 diabetes mellitus without complications: Secondary | ICD-10-CM | POA: Insufficient documentation

## 2015-12-31 DIAGNOSIS — M7532 Calcific tendinitis of left shoulder: Secondary | ICD-10-CM | POA: Insufficient documentation

## 2015-12-31 DIAGNOSIS — Z87891 Personal history of nicotine dependence: Secondary | ICD-10-CM | POA: Insufficient documentation

## 2015-12-31 MED ORDER — LIDOCAINE 5 % EX PTCH
1.0000 | MEDICATED_PATCH | CUTANEOUS | Status: DC
  Administered 2015-12-31: 1 via TRANSDERMAL
  Filled 2015-12-31: qty 1

## 2015-12-31 MED ORDER — KETOROLAC TROMETHAMINE 60 MG/2ML IM SOLN
60.0000 mg | Freq: Once | INTRAMUSCULAR | Status: AC
  Administered 2015-12-31: 60 mg via INTRAMUSCULAR
  Filled 2015-12-31: qty 2

## 2015-12-31 MED ORDER — MELOXICAM 15 MG PO TABS
15.0000 mg | ORAL_TABLET | Freq: Every day | ORAL | 0 refills | Status: AC
Start: 2015-12-31 — End: 2016-12-30

## 2015-12-31 MED ORDER — LIDOCAINE 5 % EX PTCH: 1.0000 | MEDICATED_PATCH | Freq: Two times a day (BID) | CUTANEOUS | 0 refills | Status: AC

## 2015-12-31 NOTE — ED Notes (Signed)
Patient transported to X-ray 

## 2015-12-31 NOTE — ED Triage Notes (Signed)
Patient ambulatory to triage with steady gait, without difficulty or distress noted; pt reports left FA pain since Saturday; st had right shoulder surg 7/11 and has been going thru PT;  [facility] [employer] and performing swimming and some light weight work and is concerned that he pulled a muscle

## 2015-12-31 NOTE — ED Provider Notes (Signed)
[facility] Emergency Department Provider Note   ____________________________________________   First MD Initiated Contact with Patient 12/31/15 [phone]     (approximate)  I have reviewed the triage vital signs and the nursing notes.   HISTORY  Chief Complaint Arm Pain    HPI Louis Jensen is a 60 y.o. male who comes into the hospital today with left arm pain. The patient reports it started on Saturday. He reports that July 11 he had right rotator cuff surgery. He has been doing physical therapy and is gotten down to 1 a week. He recently joined a gym in an effort to continue working out and making himself stronger. The patient reports that he decided over the weekend to work out his left arm so that he would be symmetric. He reports he also did some water therapy by moving both his arms around. He reports that later on he had some pain after working out. Sunday the pain continued. The patient went back to the gym on Monday and reports that he continued to do some weight lifting that was mild. He reports that his symptoms were very bad last night. At this time he says that he cannot get comfortable and is unable to sleep. He's taken some tramadol but reports that his pain is still 8 out of 10 in intensity. He reports that he did not use any heavy weights and hadn't done anything excessive. He denies any falls and the pain is worse to move. The patient denies any chest pain significant shortness of breath abdominal pain nausea vomiting, headache. He is here for evaluation.   Past Medical History:  Diagnosis Date  . Arthritis   . Diabetes mellitus without complication (HCC)   . Hypercholesteremia   . Hypercholesteremia   . Neuropathy (HCC)   . Sleep apnea    use C-PAP    There are no active problems to display for this patient.   Past Surgical History:  Procedure Laterality Date  . BICEPT TENODESIS Right 08/26/2015   Procedure: BICEPS TENODESIS;   Surgeon: Christena FlakeJohn J Poggi, MD;  Location: [facility] ORS;  Service: Orthopedics;  Laterality: Right;  . CARPAL TUNNEL RELEASE Bilateral   . JOINT REPLACEMENT Left    Total Knee Replacement, [place], [place], [place]  . SHOULDER ARTHROSCOPY WITH DEBRIDEMENT AND BICEP TENDON REPAIR Right 08/26/2015   Procedure: SHOULDER ARTHROSCOPY WITH DEBRIDEMENT ;  Surgeon: Christena FlakeJohn J Poggi, MD;  Location: [facility] ORS;  Service: Orthopedics;  Laterality: Right;  . SHOULDER ARTHROSCOPY WITH OPEN ROTATOR CUFF REPAIR Right 08/26/2015   Procedure: SHOULDER ARTHROSCOPY WITH OPEN ROTATOR CUFF REPAIR;  Surgeon: Christena FlakeJohn J Poggi, MD;  Location: [facility] ORS;  Service: Orthopedics;  Laterality: Right;  . SHOULDER ARTHROSCOPY WITH SUBACROMIAL DECOMPRESSION Right 08/26/2015   Procedure: SHOULDER ARTHROSCOPY WITH SUBACROMIAL DECOMPRESSION;  Surgeon: Christena FlakeJohn J Poggi, MD;  Location: [facility] ORS;  Service: Orthopedics;  Laterality: Right;  . TONSILLECTOMY    . TOTAL KNEE ARTHROPLASTY      Prior to Admission medications   Medication Sig Start Date End Date Taking? Authorizing Provider  albuterol (PROVENTIL HFA;VENTOLIN HFA) 108 (90 Base) MCG/ACT inhaler Inhale 1-2 puffs into the lungs every 6 (six) hours as needed for wheezing or shortness of breath. 07/04/15   Payton Mccallumrlando Conty, MD  atorvastatin (LIPITOR) 80 MG tablet Take 80 mg by mouth daily at 6 PM.  06/30/15   Historical Provider, MD  etodolac (LODINE) 500 MG tablet Take 1 tablet by mouth 2 (two) times daily. 06/12/15  Historical Provider, MD  gabapentin (NEURONTIN) 600 MG tablet Take 600 mg by mouth at bedtime.  07/10/15   Historical Provider, MD  lidocaine (LIDODERM) 5 % Place 1 patch onto the skin every 12 (twelve) hours. Remove & Discard patch within 12 hours or as directed by MD 12/31/15 12/30/16  Rebecka ApleyAllison P Makai Dumond, MD  meloxicam (MOBIC) 15 MG tablet Take 1 tablet (15 mg total) by mouth daily. 12/31/15 12/30/16  Rebecka ApleyAllison P Baylei Siebels, MD  metFORMIN (GLUCOPHAGE-XR) 750 MG 24 hr tablet Take 750 mg by mouth every  evening.     Historical Provider, MD  oxyCODONE (ROXICODONE) 5 MG immediate release tablet Take 1-2 tablets (5-10 mg total) by mouth every 4 (four) hours as needed for severe pain. 08/26/15   Christena FlakeJohn J Poggi, MD    Allergies Patient has no known allergies.  No family history on file.  Social History Social History  Substance Use Topics  . Smoking status: Former Smoker    Packs/day: 0.50    Types: Cigarettes  . Smokeless tobacco: Never Used     Comment: quit  . Alcohol use Yes     Comment: socially    Review of Systems Constitutional: No fever/chills Eyes: No visual changes. ENT: No sore throat. Cardiovascular: Denies chest pain. Respiratory: Denies shortness of breath. Gastrointestinal: No abdominal pain.  No nausea, no vomiting.  No diarrhea.  No constipation. Genitourinary: Negative for dysuria. Musculoskeletal: Left arm pain Skin: Negative for rash. Neurological: Negative for headaches, focal weakness or numbness.  10-point ROS otherwise negative.  ____________________________________________   PHYSICAL EXAM:  VITAL SIGNS: ED Triage Vitals [12/31/15 0031]  Enc Vitals Group     BP (!) 131/96     Pulse Rate 80     Resp 18     Temp 97.9 F (36.6 C)     Temp Source Oral     SpO2 97 %     Weight 250 lb (113.4 kg)     Height 5\' 10"  (1.778 m)     Head Circumference      Peak Flow      Pain Score 7     Pain Loc      Pain Edu?      Excl. in GC?     Constitutional: Alert and oriented. Well appearing and in Moderate distress. Eyes: Conjunctivae are normal. PERRL. EOMI. Head: Atraumatic. Nose: No congestion/rhinnorhea. Mouth/Throat: Mucous membranes are moist.  Oropharynx non-erythematous. Neck:No cervical spine tenderness to palpation. Cardiovascular: Normal rate, regular rhythm. Grossly normal heart sounds.  Good peripheral circulation. Respiratory: Normal respiratory effort.  No retractions. Lungs CTAB. Gastrointestinal: Soft and nontender. No distention.  Positive bowel sounds Musculoskeletal: Redness to palpation of left shoulder laterally as well as anteriorly. Patient has some pain with passive range of motion worse to try to raise above his head. Neurologic:  Normal speech and language.  Skin:  Skin is warm, dry and intact.  Psychiatric: Mood and affect are normal.   ____________________________________________   LABS (all labs ordered are listed, but only abnormal results are displayed)  Labs Reviewed - No data to display ____________________________________________  EKG  ED ECG REPORT I, Rebecka ApleyWebster,  Brinda Focht P, the attending physician, personally viewed and interpreted this ECG.   Date: 12/31/2015  EKG Time: 0035  Rate: 74  Rhythm: normal sinus rhythm  Axis: normal  Intervals:none  ST&T Change: normal  ____________________________________________  RADIOLOGY  Left shoulder xray ____________________________________________   PROCEDURES  Procedure(s) performed: None  Procedures  Critical Care performed: No  ____________________________________________   INITIAL IMPRESSION / ASSESSMENT AND PLAN / ED COURSE  Pertinent labs & imaging results that were available during my care of the patient were reviewed by me and considered in my medical decision making (see chart for details).  This is a 60 year old male who comes into the hospital today with some left-sided arm pain. The patient has been working out but reports he hasn't been anything excessive. I will give the patient a shot of Toradol as well as a Lidoderm patch to his arm. My concern is that the patient may have some rotator cuff injury or some tendinitis. I will send him for an x-ray and reassess the patient once he's had the x-ray.  Clinical Course as of Dec 30 337  Wed Dec 31, 2015  0206 1. No evidence of fracture or dislocation. 2. Suggestion of mild calcification at the distal insertion of the rotator cuff, which could reflect very mild calcific  tendinitis.   DG Shoulder Left [AW]    Clinical Course User Index [AW] Rebecka Apley, MD    The patient reports that his arm feels much better after the medication. He does have some mild calcification on his x-ray with a concern for calcific tendinitis. I will give him some medication for home and have him follow-up with his orthopedic surgeon. The patient has no further complaints or concerns and will be discharged. ____________________________________________   FINAL CLINICAL IMPRESSION(S) / ED DIAGNOSES  Final diagnoses:  Acute pain of left shoulder  Calcific tendinitis of left shoulder      NEW MEDICATIONS STARTED DURING THIS VISIT:  Discharge Medication List as of 12/31/2015  2:37 AM    START taking these medications   Details  lidocaine (LIDODERM) 5 % Place 1 patch onto the skin every 12 (twelve) hours. Remove & Discard patch within 12 hours or as directed by MD, Starting Wed 12/31/2015, Until Thu 12/30/2016, Print    meloxicam (MOBIC) 15 MG tablet Take 1 tablet (15 mg total) by mouth daily., Starting Wed 12/31/2015, Until Thu 12/30/2016, Print         Note:  This document was prepared using Dragon voice recognition software and may include unintentional dictation errors.    Rebecka Apley, MD 12/31/15 [phone]

## 2015-12-31 NOTE — ED Notes (Signed)
Pt c/o L upper arm pain, denies shoulder pain.  Sts that he has been doing PT for R shoulder, but recently began doing strength training w/ L arm.  Pt denies fall/injury.  Pt denies CP, SOB, LOC n/v/d or dizziness.  Pt sts arm tender to palpation.  Circulation, sensation and motor function intact.

## 2016-05-25 DIAGNOSIS — Z114 Encounter for screening for human immunodeficiency virus [HIV]: Secondary | ICD-10-CM | POA: Insufficient documentation

## 2016-11-08 DIAGNOSIS — R001 Bradycardia, unspecified: Secondary | ICD-10-CM | POA: Insufficient documentation

## 2016-11-16 DIAGNOSIS — I493 Ventricular premature depolarization: Secondary | ICD-10-CM | POA: Insufficient documentation

## 2016-11-16 DIAGNOSIS — G4733 Obstructive sleep apnea (adult) (pediatric): Secondary | ICD-10-CM | POA: Insufficient documentation

## 2016-11-16 DIAGNOSIS — G4730 Sleep apnea, unspecified: Secondary | ICD-10-CM | POA: Insufficient documentation

## 2016-12-30 DIAGNOSIS — H9193 Unspecified hearing loss, bilateral: Secondary | ICD-10-CM | POA: Insufficient documentation

## 2016-12-30 DIAGNOSIS — Z23 Encounter for immunization: Secondary | ICD-10-CM | POA: Insufficient documentation

## 2017-01-26 ENCOUNTER — Other Ambulatory Visit: Payer: Self-pay | Admitting: Sports Medicine

## 2017-01-26 DIAGNOSIS — M4722 Other spondylosis with radiculopathy, cervical region: Secondary | ICD-10-CM

## 2017-02-02 ENCOUNTER — Ambulatory Visit
Admission: RE | Admit: 2017-02-02 | Discharge: 2017-02-02 | Disposition: A | Discharge status: Home / Self Care | Payer: 59 | Source: Ambulatory Visit | Attending: Sports Medicine | Admitting: Sports Medicine

## 2017-02-02 DIAGNOSIS — M4722 Other spondylosis with radiculopathy, cervical region: Secondary | ICD-10-CM

## 2017-02-04 ENCOUNTER — Other Ambulatory Visit (HOSPITAL_COMMUNITY): Payer: Self-pay | Admitting: Sports Medicine

## 2017-02-04 ENCOUNTER — Other Ambulatory Visit: Payer: Self-pay | Admitting: Sports Medicine

## 2017-02-04 DIAGNOSIS — M4722 Other spondylosis with radiculopathy, cervical region: Secondary | ICD-10-CM

## 2017-02-23 ENCOUNTER — Other Ambulatory Visit: Payer: Self-pay

## 2017-02-23 ENCOUNTER — Encounter (HOSPITAL_COMMUNITY): Payer: Self-pay | Admitting: *Deleted

## 2017-02-23 NOTE — Progress Notes (Signed)
Anesthesia Chart Review:  Pt is a same day work up.   Pt is a 62 year old male scheduled for MRI with anesthesia on 02/24/2017.   - PCP is Rolin BarryMario Olmedo, MD. Last office visit 12/30/16 (notes in care everywhere) - Cardiologist is Arnoldo HookerBruce Kowalski, MD who sees pt for frequent PVCs and bradycardia. Last office visit 11/16/16; 1 year f/u recommended (notes in care everywhere).   PMH includes:  DM, hyperlipidemia, OSA. Former smoker. S/p R shoulder arthroscopy 08/26/15.   Medications include: lipitor, glipizide  Labs will be obtained day of surgery  EKG 11/16/16 (Dr. Philemon KingdomKowalski's office): Sinus rhythm with PACs. Incomplete RBBB.  Exercise treadmill test 11/11/16 (Dr. Philemon KingdomKowalski's office): 1. Normal treadmill ECG without evidence of ischemia or arrhythmia  Holter monitor 11/11/16 (Dr. Philemon KingdomKowalski's office): And. Baseline NSR with max HR 107 bpm, min 40 bpm, average 64 bpm. 2. Very frequent PACs and PVCs. 3. HR variable depending on activity levels. There is some. Of a symptomatically bradycardia. 4. Multiple periods of time of atrial bigeminy, multiple periods of time 3-5 beats of SVT, and multiple periods of time of ventricular bigeminy with no other malignant tachycardia or heart block.  If labs acceptable day of surgery, I anticipate pt can proceed with surgery as scheduled.   Rica Mastngela Ailee Pates, FNP-BC [facility]Anesthesiology Phone: [phone] 02/23/2017 3:00 PM

## 2017-02-23 NOTE — Progress Notes (Signed)
Pt denies SOB and chest pain. Pt under the care of Dr. Gwen PoundsKowalski, Cardiology .Pt denies having an echo and cardiac cath. Cardiac records and A1c requested from Dr. Gwen PoundsKowalski. Pt made aware to stop taking vitamins, Alpha Lipoic Acid  and herbal medications. Pt made aware to not take Glipizide DOS. Pt stated that he does not have a glucometer to check blood glucose. Pt verbalized understanding of all pre-op instructions. Anesthesia made aware of pt cardiac history.

## 2017-02-24 ENCOUNTER — Encounter (HOSPITAL_COMMUNITY): Payer: Self-pay

## 2017-02-24 ENCOUNTER — Ambulatory Visit (HOSPITAL_COMMUNITY)
Admission: RE | Admit: 2017-02-24 | Discharge: 2017-02-24 | Disposition: A | Discharge status: Home / Self Care | Payer: 59 | Source: Ambulatory Visit | Attending: Sports Medicine | Admitting: Sports Medicine

## 2017-02-24 ENCOUNTER — Ambulatory Visit (HOSPITAL_COMMUNITY): Payer: 59 | Admitting: Emergency Medicine

## 2017-02-24 ENCOUNTER — Encounter (HOSPITAL_COMMUNITY)
Admission: RE | Disposition: A | Discharge status: Home / Self Care | Payer: Self-pay | Source: Ambulatory Visit | Attending: Sports Medicine

## 2017-02-24 DIAGNOSIS — M4802 Spinal stenosis, cervical region: Secondary | ICD-10-CM | POA: Diagnosis not present

## 2017-02-24 DIAGNOSIS — M4722 Other spondylosis with radiculopathy, cervical region: Secondary | ICD-10-CM | POA: Diagnosis not present

## 2017-02-24 DIAGNOSIS — Z79899 Other long term (current) drug therapy: Secondary | ICD-10-CM | POA: Diagnosis not present

## 2017-02-24 DIAGNOSIS — E119 Type 2 diabetes mellitus without complications: Secondary | ICD-10-CM | POA: Diagnosis not present

## 2017-02-24 DIAGNOSIS — Z87891 Personal history of nicotine dependence: Secondary | ICD-10-CM | POA: Diagnosis not present

## 2017-02-24 DIAGNOSIS — E785 Hyperlipidemia, unspecified: Secondary | ICD-10-CM | POA: Insufficient documentation

## 2017-02-24 DIAGNOSIS — G4733 Obstructive sleep apnea (adult) (pediatric): Secondary | ICD-10-CM | POA: Insufficient documentation

## 2017-02-24 DIAGNOSIS — M5011 Cervical disc disorder with radiculopathy,  high cervical region: Secondary | ICD-10-CM | POA: Insufficient documentation

## 2017-02-24 DIAGNOSIS — M542 Cervicalgia: Secondary | ICD-10-CM | POA: Diagnosis present

## 2017-02-24 HISTORY — PX: RADIOLOGY WITH ANESTHESIA: SHX6223

## 2017-02-24 LAB — GLUCOSE, CAPILLARY
GLUCOSE-CAPILLARY: 108 mg/dL — AB (ref 65–99)
Glucose-Capillary: 118 mg/dL — ABNORMAL HIGH (ref 65–99)

## 2017-02-24 LAB — CBC
HCT: 44.1 % (ref 39.0–52.0)
HEMOGLOBIN: 14.4 g/dL (ref 13.0–17.0)
MCH: 27.4 pg (ref 26.0–34.0)
MCHC: 32.7 g/dL (ref 30.0–36.0)
MCV: 84 fL (ref 78.0–100.0)
Platelets: 246 10*3/uL (ref 150–400)
RBC: 5.25 MIL/uL (ref 4.22–5.81)
RDW: 13.9 % (ref 11.5–15.5)
WBC: 7.5 10*3/uL (ref 4.0–10.5)

## 2017-02-24 LAB — BASIC METABOLIC PANEL
ANION GAP: 11 (ref 5–15)
BUN: 14 mg/dL (ref 6–20)
CHLORIDE: 106 mmol/L (ref 101–111)
CO2: 20 mmol/L — ABNORMAL LOW (ref 22–32)
Calcium: 8.9 mg/dL (ref 8.9–10.3)
Creatinine, Ser: 0.81 mg/dL (ref 0.61–1.24)
GFR calc Af Amer: >60 mL/min (ref >60)
Glucose, Bld: 114 mg/dL — ABNORMAL HIGH (ref 65–99)
POTASSIUM: 3.8 mmol/L (ref 3.5–5.1)
SODIUM: 137 mmol/L (ref 135–145)

## 2017-02-24 SURGERY — MRI WITH ANESTHESIA
Anesthesia: General

## 2017-02-24 MED ORDER — OXYCODONE HCL 5 MG PO TABS
5.0000 mg | ORAL_TABLET | Freq: Once | ORAL | Status: AC
  Administered 2017-02-24: 5 mg via ORAL

## 2017-02-24 MED ORDER — PROMETHAZINE HCL 25 MG/ML IJ SOLN: 6.2500 mg | INTRAMUSCULAR | Status: DC | PRN

## 2017-02-24 MED ORDER — OXYCODONE HCL 5 MG PO TABS
ORAL_TABLET | ORAL | Status: DC
Start: 2017-02-24 — End: 2017-02-24
  Filled 2017-02-24: qty 1

## 2017-02-24 MED ORDER — FENTANYL CITRATE (PF) 100 MCG/2ML IJ SOLN: 25.0000 ug | INTRAMUSCULAR | Status: DC | PRN

## 2017-02-24 MED ORDER — LACTATED RINGERS IV SOLN
INTRAVENOUS | Status: DC
  Administered 2017-02-24: 08:00:00 via INTRAVENOUS

## 2017-02-24 NOTE — Anesthesia Preprocedure Evaluation (Addendum)
Anesthesia Evaluation  Patient identified by MRN, date of birth, ID band Patient awake    Reviewed: Allergy & Precautions, NPO status , Patient's Chart, lab work & pertinent test results  Airway Mallampati: II  TM Distance: >3 FB    Comment: Large neck Dental  (+) Chipped, Dental Advisory Given   Pulmonary sleep apnea and Continuous Positive Airway Pressure Ventilation , former smoker,    Pulmonary exam normal        Cardiovascular negative cardio ROS Normal cardiovascular exam     Neuro/Psych Peripheral neuropathy, mostly in feet  Neuromuscular disease negative psych ROS   GI/Hepatic negative GI ROS, Neg liver ROS,   Endo/Other  diabetes, Well Controlled, Type 2, Oral Hypoglycemic AgentsMorbid obesity  Renal/GU negative Renal ROS  negative genitourinary   Musculoskeletal  (+) Arthritis , Osteoarthritis,    Abdominal Normal abdominal exam  (+)   Peds negative pediatric ROS (+)  Hematology negative hematology ROS (+)   Anesthesia Other Findings   Reproductive/Obstetrics                            Anesthesia Physical  Anesthesia Plan  ASA: III  Anesthesia Plan: General   Post-op Pain Management:    Induction: Intravenous  PONV Risk Score and Plan: 2 and Ondansetron and Dexamethasone  Airway Management Planned: LMA  Additional Equipment:   Intra-op Plan:   Post-operative Plan: Extubation in OR  Informed Consent: I have reviewed the patients History and Physical, chart, labs and discussed the procedure including the risks, benefits and alternatives for the proposed anesthesia with the patient or authorized representative who has indicated his/her understanding and acceptance.   Dental advisory given  Plan Discussed with: CRNA and Surgeon  Anesthesia Plan Comments:         Anesthesia Quick Evaluation

## 2017-02-24 NOTE — Anesthesia Postprocedure Evaluation (Signed)
Anesthesia Post Note  Patient: Louis MilroyHarold P Jensen  Procedure(s) Performed: MRI WITH ANESTHESIA CERVICAL SPINE WITHOUT (N/A )     Patient location during evaluation: PACU Anesthesia Type: General Level of consciousness: sedated Pain management: pain level controlled Vital Signs Assessment: post-procedure vital signs reviewed and stable Respiratory status: spontaneous breathing and respiratory function stable Cardiovascular status: stable Postop Assessment: no apparent nausea or vomiting Anesthetic complications: no    Last Vitals:  Vitals:   02/24/17 1145 02/24/17 1200  BP: (!) 142/86 (!) 157/79  Pulse: (!) 55 76  Resp:  17  Temp:    SpO2:  98%    Last Pain:  Vitals:   02/24/17 1145  TempSrc:   PainSc: 7                  Gerasimos Plotts DANIEL

## 2017-02-24 NOTE — Transfer of Care (Signed)
Immediate Anesthesia Transfer of Care Note  Patient: Chancy MilroyHarold P Woessner  Procedure(s) Performed: MRI WITH ANESTHESIA CERVICAL SPINE WITHOUT (N/A )  Patient Location: PACU  Anesthesia Type:General  Level of Consciousness: awake, alert  and oriented  Airway & Oxygen Therapy: Patient Spontanous Breathing and Patient connected to nasal cannula oxygen  Post-op Assessment: Report given to RN, Post -op Vital signs reviewed and stable and Patient moving all extremities X 4  Post vital signs: Reviewed and stable  Last Vitals:  Vitals:   02/24/17 0812 02/24/17 1119  BP: 137/86 130/84  Pulse: (!) 53 67  Resp: 20 20  Temp:  (!) 36.3 C  SpO2: 98% 98%    Last Pain:  Vitals:   02/24/17 0812  TempSrc: Oral  PainSc: 6       Patients Stated Pain Goal: 3 (02/24/17 [id number])  Complications: No apparent anesthesia complications

## 2017-02-25 ENCOUNTER — Encounter (HOSPITAL_COMMUNITY): Payer: Self-pay | Admitting: Radiology

## 2017-02-28 ENCOUNTER — Other Ambulatory Visit: Payer: Self-pay | Admitting: Sports Medicine

## 2017-02-28 DIAGNOSIS — M5030 Other cervical disc degeneration, unspecified cervical region: Secondary | ICD-10-CM

## 2017-03-03 ENCOUNTER — Ambulatory Visit
Admission: RE | Admit: 2017-03-03 | Discharge: 2017-03-03 | Disposition: A | Discharge status: Home / Self Care | Payer: 59 | Source: Ambulatory Visit | Attending: Sports Medicine | Admitting: Sports Medicine

## 2017-03-03 DIAGNOSIS — M5030 Other cervical disc degeneration, unspecified cervical region: Secondary | ICD-10-CM

## 2017-03-03 MED ORDER — IOPAMIDOL (ISOVUE-M 300) INJECTION 61%
1.0000 mL | Freq: Once | INTRAMUSCULAR | Status: AC | PRN
  Administered 2017-03-03: 1 mL via EPIDURAL

## 2017-03-03 MED ORDER — TRIAMCINOLONE ACETONIDE 40 MG/ML IJ SUSP (RADIOLOGY)
60.0000 mg | Freq: Once | INTRAMUSCULAR | Status: AC
  Administered 2017-03-03: 60 mg via EPIDURAL

## 2017-03-03 NOTE — Discharge Instructions (Signed)

## 2017-03-14 ENCOUNTER — Other Ambulatory Visit: Payer: Self-pay | Admitting: Nurse Practitioner

## 2017-03-14 DIAGNOSIS — M47816 Spondylosis without myelopathy or radiculopathy, lumbar region: Secondary | ICD-10-CM

## 2017-03-25 ENCOUNTER — Ambulatory Visit
Admission: RE | Admit: 2017-03-25 | Discharge: 2017-03-25 | Disposition: A | Discharge status: Home / Self Care | Payer: 59 | Source: Ambulatory Visit | Attending: Nurse Practitioner | Admitting: Nurse Practitioner

## 2017-03-25 DIAGNOSIS — M47816 Spondylosis without myelopathy or radiculopathy, lumbar region: Secondary | ICD-10-CM

## 2017-03-25 MED ORDER — IOPAMIDOL (ISOVUE-M 300) INJECTION 61%
1.0000 mL | Freq: Once | INTRAMUSCULAR | Status: AC | PRN
  Administered 2017-03-25: 1 mL via EPIDURAL

## 2017-03-25 MED ORDER — TRIAMCINOLONE ACETONIDE 40 MG/ML IJ SUSP (RADIOLOGY)
60.0000 mg | Freq: Once | INTRAMUSCULAR | Status: AC
  Administered 2017-03-25: 60 mg via EPIDURAL

## 2017-06-30 DIAGNOSIS — E119 Type 2 diabetes mellitus without complications: Secondary | ICD-10-CM | POA: Insufficient documentation

## 2017-10-03 ENCOUNTER — Encounter: Payer: Self-pay | Admitting: Podiatry

## 2017-10-03 ENCOUNTER — Ambulatory Visit (INDEPENDENT_AMBULATORY_CARE_PROVIDER_SITE_OTHER): Payer: 59 | Admitting: Podiatry

## 2017-10-03 DIAGNOSIS — M79676 Pain in unspecified toe(s): Secondary | ICD-10-CM | POA: Diagnosis not present

## 2017-10-03 DIAGNOSIS — B351 Tinea unguium: Secondary | ICD-10-CM | POA: Diagnosis not present

## 2017-10-03 NOTE — Progress Notes (Signed)
He presents today chief concern of a discoloration of the hallux nail times the past 2 to 3 months states that is somewhat tender.  He denies any trauma.  States that he goes to the gym on a daily basis to work out.  He states that his neuropathy has not gotten any better with the gabapentin and [facility]  Objective: Vital signs are stable he is alert oriented x3 subungual hematoma nonfluctuant no drainage no erythema cellulitis odor.  Toenails are long thick yellow dystrophic possibly mycotic 1 through 5 bilateral.  Pulses are strong with palpable degenerative flexors are intact muscle strength is normal symmetrical.  Assessment: Neuropathy and pain in limb secondary to neuropathy with onychomycosis and subungual hematoma hallux right.  Plan: Discussed etiology pathology conservative surgical therapies at this point time performed nail debridement 1 through 5 bilateral recommended that he follow-up with neurology for his neuropathy.  We will make sure that he is on a regular schedule to have his nails trimmed.

## 2017-11-06 DIAGNOSIS — E1142 Type 2 diabetes mellitus with diabetic polyneuropathy: Secondary | ICD-10-CM | POA: Insufficient documentation

## 2017-12-29 ENCOUNTER — Ambulatory Visit: Payer: 59 | Admitting: Podiatry

## 2018-01-30 DIAGNOSIS — G47419 Narcolepsy without cataplexy: Secondary | ICD-10-CM | POA: Insufficient documentation

## 2019-07-16 IMAGING — XA DG INJECT/[PERSON_NAME] INC NEEDLE/CATH/PLC EPI/CERV/THOR W/IMG
2 series · 2 of 2 positions shown · non-contrast
Comparison: none

CLINICAL DATA: Degenerative disc disease. Arthritis. Right upper
extremity radiculopathy. Right C5 and C7 radicular symptoms.

[Series 1: ortho standard · 1 of 1 slices shown (1 of 2)]
[im 1/1]
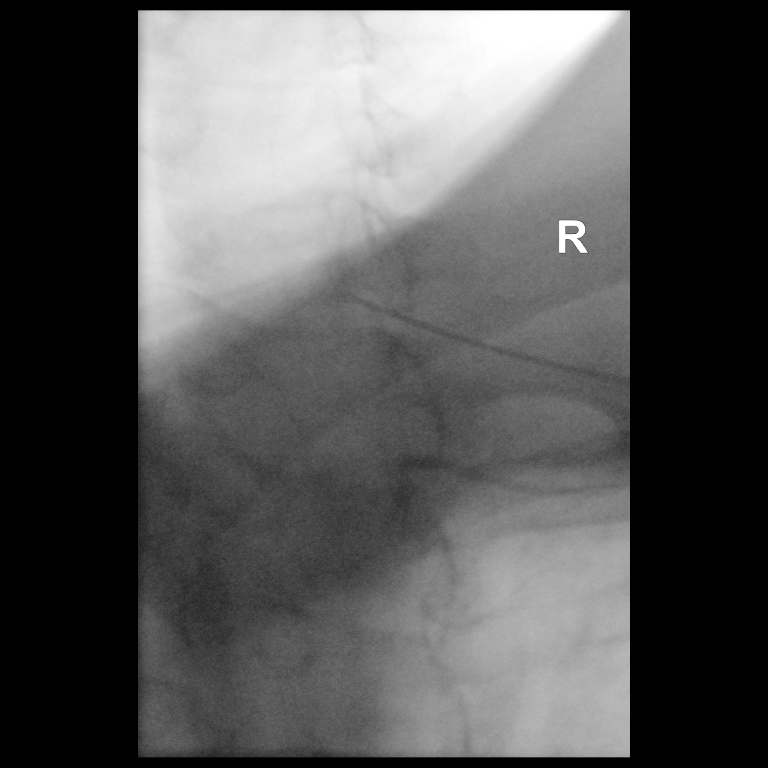

[Series 2: ortho standard · 1 of 1 slices shown (2 of 2)]
[im 1/1]
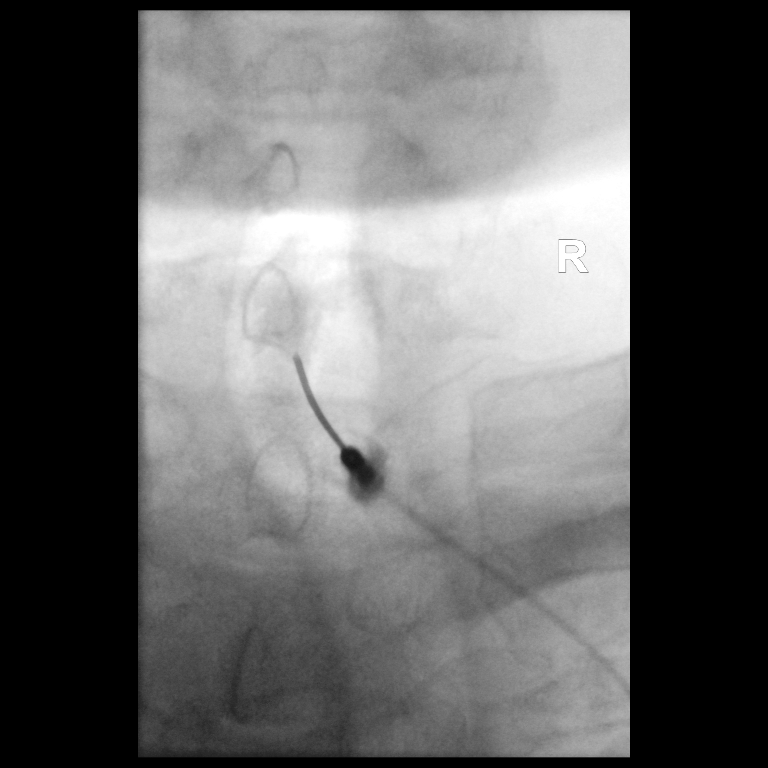

[2 of 2 positions shown; findings below may reference images not displayed]

FLUOROSCOPY TIME:  Radiation Exposure Index (as provided by the
fluoroscopic device): 20.20 uGy*m2

Fluoroscopy Time:  17 seconds

Number of Acquired Images:  0

PROCEDURE:
CERVICAL EPIDURAL INJECTION

An interlaminar approach was performed on the right at C7-T1. A 20
gauge epidural needle was advanced using loss-of-resistance
technique.

DIAGNOSTIC EPIDURAL INJECTION

Injection of Isovue-M 300 shows a good epidural pattern with spread
above and below the level of needle placement, primarily on the
right. No vascular opacification is seen. THERAPEUTIC

EPIDURAL INJECTION

1.5 ml of Kenalog 40 mixed with 1 ml of 1% Lidocaine and 2 ml of
normal saline were then instilled. The procedure was well-tolerated,
and the patient was discharged thirty minutes following the
injection in good condition.
IMPRESSION: Technically successful first epidural injection on the right at
C7-T1.

## 2021-03-20 LAB — EXTERNAL GENERIC LAB PROCEDURE: COLOGUARD: NEGATIVE

## 2021-03-20 LAB — COLOGUARD: COLOGUARD: NEGATIVE

## 2021-05-04 DIAGNOSIS — M47817 Spondylosis without myelopathy or radiculopathy, lumbosacral region: Secondary | ICD-10-CM | POA: Insufficient documentation

## 2021-07-10 DIAGNOSIS — M5417 Radiculopathy, lumbosacral region: Secondary | ICD-10-CM | POA: Insufficient documentation

## 2021-10-26 DIAGNOSIS — M5416 Radiculopathy, lumbar region: Secondary | ICD-10-CM | POA: Insufficient documentation

## 2021-12-31 DIAGNOSIS — I499 Cardiac arrhythmia, unspecified: Secondary | ICD-10-CM | POA: Insufficient documentation

## 2022-08-04 DIAGNOSIS — E1142 Type 2 diabetes mellitus with diabetic polyneuropathy: Secondary | ICD-10-CM | POA: Insufficient documentation

## 2023-01-26 ENCOUNTER — Encounter: Payer: Self-pay | Admitting: *Deleted

## 2023-01-28 NOTE — Progress Notes (Unsigned)
Cardiology Office Note  Date:  01/31/2023   ID:  Estaban, Murray 09/15/1955, MRN [id number]  PCP:  Dione Housekeeper, MD   Chief Complaint  Patient presents with   New Patient (Initial Visit)    Self referral to establish care for A-Fib; patient was seen at [facility] with Dr. Astrid Drafts.in 2023. Medications reviewed by the patient verbally.     HPI:  Mr. Louis Jensen is a 67 year old gentleman with past medical history of Smoking history, early years 15 to 24 Sleep apnea Hyperlipidemia PVCs Diabetes type 2 Who presents for new patient evaluation of his frequent PVCs and bradycardia, permanent atrial fibrillation  Reports that he underwent knee surgery 2022, surgery was canceled as he had bradycardia Reports that he establish with cardiology in [facility] Was cleared for surgery 2023 At some point in 2023 diagnosed with atrial fibrillation Did not get a chance to see EP as he was moving Feels he has been in atrial fibrillation for over 1 year, minimal symptoms, may be some mild fatigue  [facility]en [facility] 2018 for shortness of breath and fatigue  Holter monitor 11/11/16 (Dr. Philemon Kingdom office):  NSR with max HR 107 bpm, min 40 bpm, average 64 bpm. 2. Very frequent PACs and PVCs.  Had echo and stress 6 months ago in [place], Records requested  Scheduled to see Dr. 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 Back pain, on tramadol No regular exercise program  Wife with knee replacement  EKG personally reviewed by myself on todays visit EKG Interpretation Date/Time:  Monday January 31 2023 09:36:54 EST Ventricular Rate:  85 PR Interval:    QRS Duration:  92 QT Interval:  350 QTC Calculation: 416 R Axis:   55  Text Interpretation: Atrial fibrillation When compared with ECG of 31-Dec-2015 00:35, Atrial fibrillation has replaced Sinus rhythm Confirmed by Julien Nordmann [phone]) on 01/31/2023 9:42:46 AM    PMH:   has a past medical history of A-fib (HCC), Arrhythmia, Arthritis, Diabetes mellitus without complication (HCC), Hypercholesteremia, Hypercholesteremia, Neuropathy, and Sleep apnea.  PSH:    Past Surgical History:  Procedure Laterality Date   APPENDECTOMY     BICEPT TENODESIS Right 08/26/2015   Procedure: BICEPS TENODESIS;  Surgeon: Christena Flake, MD;  Location: [facility];  Service: Orthopedics;  Laterality: Right;   CARPAL TUNNEL RELEASE Bilateral    JOINT REPLACEMENT Left    Total Knee Replacement, [facility], , [place]   RADIOLOGY WITH ANESTHESIA N/A 02/24/2017   Procedure: MRI WITH ANESTHESIA CERVICAL SPINE WITHOUT;  Surgeon: Radiologist, Medication, MD;  Location: [facility] OR;  Service: Radiology;  Laterality: N/A;   SHOULDER ARTHROSCOPY WITH DEBRIDEMENT AND BICEP TENDON REPAIR Right 08/26/2015   Procedure: SHOULDER ARTHROSCOPY WITH DEBRIDEMENT ;  Surgeon: Christena Flake, MD;  Location: [facility];  Service: Orthopedics;  Laterality: Right;   SHOULDER ARTHROSCOPY WITH OPEN ROTATOR CUFF REPAIR Right 08/26/2015   Procedure: SHOULDER ARTHROSCOPY WITH OPEN ROTATOR CUFF REPAIR;  Surgeon: Christena Flake, MD;  Location: [facility];  Service: Orthopedics;  Laterality: Right;   SHOULDER ARTHROSCOPY WITH SUBACROMIAL DECOMPRESSION Right 08/26/2015   Procedure: SHOULDER ARTHROSCOPY WITH SUBACROMIAL DECOMPRESSION;  Surgeon: Christena Flake, MD;  Location: [facility];  Service: Orthopedics;  Laterality: Right;   TONSILLECTOMY     TOTAL KNEE ARTHROPLASTY      Current Outpatient Medications  Medication Sig Dispense Refill   acetaminophen (TYLENOL) 500  MG tablet Take 500-1,000 mg by mouth every 6 (six) hours as needed (for pain.).     Alpha Lipoic Acid 200 MG CAPS Take 200 mg by mouth daily.     atorvastatin (LIPITOR) 80 MG tablet Take 80 mg by mouth daily.   3   dabigatran (PRADAXA) 150 MG CAPS capsule Take 150 mg by mouth 2 (two) times daily.     diclofenac (VOLTAREN) 75  MG EC tablet Take 75 mg by mouth 2 (two) times daily.     glipiZIDE (GLUCOTROL XL) 5 MG 24 hr tablet Take 5 mg by mouth daily with breakfast.     metFORMIN (GLUCOPHAGE-XR) 500 MG 24 hr tablet Take 1 tablet by mouth 2 (two) times daily.     metoprolol succinate (TOPROL-XL) 25 MG 24 hr tablet Take 1 tablet by mouth daily.     traMADol (ULTRAM) 50 MG tablet Take 1 tablet by mouth 2 (two) times daily.     No current facility-administered medications for this visit.    Allergies:   Metformin and Pregabalin   Social History:  The patient  reports that he has quit smoking. His smoking use included cigarettes. He has never used smokeless tobacco. He reports current alcohol use. He reports that he does not use drugs.   Family History:   family history is not on file.   Review of Systems: Review of Systems  Constitutional: Negative.   HENT: Negative.    Respiratory: Negative.    Cardiovascular: Negative.   Gastrointestinal: Negative.   Musculoskeletal: Negative.   Neurological: Negative.   Psychiatric/Behavioral: Negative.    All other systems reviewed and are negative.  PHYSICAL EXAM: VS:  BP 110/80 (BP Location: Right Arm, Patient Position: Sitting, Cuff Size: Normal)   Pulse 85   Ht 5' 10.5" (1.791 m)   Wt 266 lb (120.7 kg)   SpO2 97%   BMI 37.63 kg/m  , BMI Body mass index is 37.63 kg/m. GEN: Well nourished, well developed, in no acute distress HEENT: normal Neck: no JVD, carotid bruits, or masses Cardiac: irreg irreg  no murmurs, rubs, or gallops,no edema  Respiratory:  clear to auscultation bilaterally, normal work of breathing GI: soft, nontender, nondistended, + BS MS: no deformity or atrophy Skin: warm and dry, no rash Neuro:  Strength and sensation are intact Psych: euthymic mood, full affect  Recent Labs: No results found for requested labs within last 365 days.   Lipid Panel No results found for: "CHOL", "HDL", "LDLCALC", "TRIG"   Wt Readings from Last 3  Encounters:  01/31/23 266 lb (120.7 kg)  02/24/17 247 lb (112 kg)  12/31/15 250 lb (113.4 kg)    ASSESSMENT AND PLAN:  Problem List Items Addressed This Visit       Cardiology Problems   Frequent PVCs   Relevant Medications   dabigatran (PRADAXA) 150 MG CAPS capsule   metoprolol succinate (TOPROL-XL) 25 MG 24 hr tablet   Other Relevant Orders   EKG 12-Lead (Completed)   Hypertriglyceridemia   Relevant Medications   dabigatran (PRADAXA) 150 MG CAPS capsule   metoprolol succinate (TOPROL-XL) 25 MG 24 hr tablet     Other   Bradycardia   Relevant Orders   EKG 12-Lead (Completed)   Morbid obesity (HCC)   Relevant Medications   metFORMIN (GLUCOPHAGE-XR) 500 MG 24 hr tablet   OSA (obstructive sleep apnea)   Other Visit Diagnoses       Permanent atrial fibrillation (HCC)    -  Primary  Relevant Medications   dabigatran (PRADAXA) 150 MG CAPS capsule   metoprolol succinate (TOPROL-XL) 25 MG 24 hr tablet     Smoker          Permanent atrial fibrillation Noted in 2023 while he was living in [place]  minimally symptomatic,, mild fatigue Denies significant lower extremity edema, no PND orthopnea Rate is relatively well-controlled without medication Reports having stress test and echocardiogram 6 months ago, records have been requested On Pradaxa, costing him $80 per month He might try Eliquis next year with different insurance He prefers to have baseline lab work with primary care later this week including CBC  Hyperlipidemia Tolerating Lipitor 80 daily He have lipids done when he sees primary care  History of smoking Stopped at age 17  Morbid obesity/sleep apnea Unclear if he is on CPAP  Frequent PVCs/bradycardia Reports workup when he was in [place] Asymptomatic   Signed, Dossie Arbour, M.D., Ph.D. [facility] [place], [place] [phone]

## 2023-01-31 ENCOUNTER — Ambulatory Visit: Payer: Medicare Other | Attending: Cardiovascular Disease | Admitting: Cardiovascular Disease

## 2023-01-31 ENCOUNTER — Encounter: Payer: Self-pay | Admitting: Cardiovascular Disease

## 2023-01-31 VITALS — BP 110/80 | HR 85 | Ht 70.5 in | Wt 266.0 lb

## 2023-01-31 DIAGNOSIS — E781 Pure hyperglyceridemia: Secondary | ICD-10-CM | POA: Diagnosis present

## 2023-01-31 DIAGNOSIS — G4733 Obstructive sleep apnea (adult) (pediatric): Secondary | ICD-10-CM | POA: Insufficient documentation

## 2023-01-31 DIAGNOSIS — F17200 Nicotine dependence, unspecified, uncomplicated: Secondary | ICD-10-CM | POA: Insufficient documentation

## 2023-01-31 DIAGNOSIS — R001 Bradycardia, unspecified: Secondary | ICD-10-CM | POA: Diagnosis not present

## 2023-01-31 DIAGNOSIS — I493 Ventricular premature depolarization: Secondary | ICD-10-CM | POA: Insufficient documentation

## 2023-01-31 DIAGNOSIS — I4821 Permanent atrial fibrillation: Secondary | ICD-10-CM | POA: Insufficient documentation

## 2023-01-31 NOTE — Patient Instructions (Addendum)
Medication Instructions:  No changes  If you need a refill on your cardiac medications before your next appointment, please call your pharmacy.    Lab work: No new labs needed   Testing/Procedures: No new testing needed   Follow-Up: At [facility], you and your health needs are our priority.  As part of our continuing mission to provide you with exceptional heart care, we have created designated Provider Care Teams.  These Care Teams include your primary Cardiologist (physician) and Advanced Practice Providers (APPs -  Physician Assistants and Nurse Practitioners) who all work together to provide you with the care you need, when you need it.  You will need a follow up appointment in 6 months  Providers on your designated Care Team:   Christopher Berge, NP Ryan Dunn, PA-C Cadence Furth, PA-C  COVID-19 Vaccine Information can be found at: [email or web address] For questions related to vaccine distribution or appointments, please email [email or web address] or call [phone].   

## 2023-03-15 ENCOUNTER — Other Ambulatory Visit: Payer: Self-pay | Admitting: Neurosurgery

## 2023-03-15 DIAGNOSIS — M48061 Spinal stenosis, lumbar region without neurogenic claudication: Secondary | ICD-10-CM

## 2023-03-28 NOTE — Discharge Instructions (Signed)

## 2023-03-29 ENCOUNTER — Ambulatory Visit
Admission: RE | Admit: 2023-03-29 | Discharge: 2023-03-29 | Disposition: A | Discharge status: Home / Self Care | Payer: Medicare Other | Source: Ambulatory Visit | Attending: Neurosurgery | Admitting: Neurosurgery

## 2023-03-29 DIAGNOSIS — M48061 Spinal stenosis, lumbar region without neurogenic claudication: Secondary | ICD-10-CM

## 2023-03-29 MED ORDER — DIAZEPAM 5 MG PO TABS
5.0000 mg | ORAL_TABLET | Freq: Once | ORAL | Status: AC
  Administered 2023-03-29: 5 mg via ORAL

## 2023-03-29 MED ORDER — MEPERIDINE HCL 50 MG/ML IJ SOLN: 50.0000 mg | Freq: Once | INTRAMUSCULAR | Status: DC | PRN

## 2023-03-29 MED ORDER — IOPAMIDOL (ISOVUE-M 200) INJECTION 41%
20.0000 mL | Freq: Once | INTRAMUSCULAR | Status: AC
  Administered 2023-03-29: 20 mL via INTRATHECAL

## 2023-03-29 MED ORDER — ONDANSETRON HCL 4 MG/2ML IJ SOLN: 4.0000 mg | Freq: Once | INTRAMUSCULAR | Status: DC | PRN

## 2023-03-30 ENCOUNTER — Other Ambulatory Visit: Payer: Medicare Other

## 2023-04-25 ENCOUNTER — Ambulatory Visit (INDEPENDENT_AMBULATORY_CARE_PROVIDER_SITE_OTHER): Payer: 59 | Admitting: Podiatry

## 2023-04-25 ENCOUNTER — Encounter: Payer: Self-pay | Admitting: Podiatry

## 2023-04-25 DIAGNOSIS — M79676 Pain in unspecified toe(s): Secondary | ICD-10-CM

## 2023-04-25 DIAGNOSIS — Z87891 Personal history of nicotine dependence: Secondary | ICD-10-CM | POA: Insufficient documentation

## 2023-04-25 DIAGNOSIS — M1710 Unilateral primary osteoarthritis, unspecified knee: Secondary | ICD-10-CM | POA: Insufficient documentation

## 2023-04-25 DIAGNOSIS — E1142 Type 2 diabetes mellitus with diabetic polyneuropathy: Secondary | ICD-10-CM

## 2023-04-25 DIAGNOSIS — M1990 Unspecified osteoarthritis, unspecified site: Secondary | ICD-10-CM | POA: Insufficient documentation

## 2023-04-25 DIAGNOSIS — B351 Tinea unguium: Secondary | ICD-10-CM

## 2023-04-25 DIAGNOSIS — M79601 Pain in right arm: Secondary | ICD-10-CM | POA: Insufficient documentation

## 2023-04-25 NOTE — Progress Notes (Signed)
 Presents today chief complaint of painful elongated toenails.  Hemoglobin A1c is at 7.1% states that he takes 2400 mg of gabapentin daily and still has breakthrough pain with his neuropathy.  Objective: Vital signs are stable he is alert and oriented x 3.  Pulses are palpable.  Neurologic sensorium is intact.  Deep tendon reflexes are intact muscle strength is normal and symmetrical bilateral.  Orthopedic evaluation demonstrates all joints distal to the ankle full range of motion without crepitation.  Cutaneous evaluation demonstrates supple well-hydrated cutis toenails are long thick yellow dystrophic clinically mycotic no open lesions or wounds are noted.  Assessment: Diabetes mellitus with diabetic peripheral neuropathy pain limb secondary to onychomycosis.  Plan: Debridement of toenails 1 through 5 bilateral.

## 2023-06-12 ENCOUNTER — Encounter: Payer: Self-pay | Admitting: Cardiovascular Disease

## 2023-06-13 ENCOUNTER — Other Ambulatory Visit: Payer: Self-pay | Admitting: *Deleted

## 2023-06-13 MED ORDER — METOPROLOL SUCCINATE ER 25 MG PO TB24: 25.0000 mg | ORAL_TABLET | Freq: Every day | ORAL | 0 refills | Status: DC

## 2023-07-27 ENCOUNTER — Ambulatory Visit (INDEPENDENT_AMBULATORY_CARE_PROVIDER_SITE_OTHER): Admitting: Podiatry

## 2023-07-27 ENCOUNTER — Encounter: Payer: Self-pay | Admitting: Podiatry

## 2023-07-27 DIAGNOSIS — B351 Tinea unguium: Secondary | ICD-10-CM

## 2023-07-27 DIAGNOSIS — M79676 Pain in unspecified toe(s): Secondary | ICD-10-CM | POA: Diagnosis not present

## 2023-07-27 DIAGNOSIS — E1142 Type 2 diabetes mellitus with diabetic polyneuropathy: Secondary | ICD-10-CM | POA: Diagnosis not present

## 2023-07-27 NOTE — Progress Notes (Signed)
 He presents today chief complaint of painful elongated toenails.  Objective: Pulses are palpable.  No open lesions or wounds.  Toenails are long thick yellow dystrophic clinically mycotic.  No change in neuropathy.  Assessment: Diabetic peripheral neuropathy painful elongated toenails.  Plan: Debridement of toenails 1 through 5 bilateral.

## 2023-08-09 ENCOUNTER — Ambulatory Visit
Attending: Student in an Organized Health Care Education/Training Program | Admitting: Student in an Organized Health Care Education/Training Program

## 2023-08-09 ENCOUNTER — Encounter: Payer: Self-pay | Admitting: Student in an Organized Health Care Education/Training Program

## 2023-08-09 VITALS — BP 138/94 | HR 70 | Temp 97.3°F | Ht 70.0 in | Wt 260.0 lb

## 2023-08-09 DIAGNOSIS — E1140 Type 2 diabetes mellitus with diabetic neuropathy, unspecified: Secondary | ICD-10-CM | POA: Diagnosis present

## 2023-08-09 DIAGNOSIS — G894 Chronic pain syndrome: Secondary | ICD-10-CM | POA: Diagnosis present

## 2023-08-09 DIAGNOSIS — M792 Neuralgia and neuritis, unspecified: Secondary | ICD-10-CM | POA: Diagnosis present

## 2023-08-09 NOTE — Progress Notes (Signed)
 Safety precautions to be maintained throughout the outpatient stay will include: orient to surroundings, keep bed in low position, maintain call bell within reach at all times, provide assistance with transfer out of bed and ambulation.

## 2023-08-09 NOTE — Progress Notes (Signed)
 PROVIDER NOTE: Interpretation of information contained herein should be left to medically-trained personnel. Specific patient instructions are provided elsewhere under Patient Instructions section of medical record. This document was created in part using AI and STT-dictation technology, any transcriptional errors that may result from this process are unintentional.  Patient: Louis Jensen  Service: E/M Encounter  Provider: Wallie Sherry, MD  DOB: 1955/12/28  Delivery: Face-to-face  Specialty: Interventional Pain Management  MRN: [id number]  Setting: Ambulatory outpatient facility  Specialty designation: 09  Type: New Patient  Location: Outpatient office facility  PCP: Louis Bette Hover, MD  DOS: 08/09/2023    Referring Prov.: Louis Jannett POUR, MD   Primary Reason(s) for Visit: Encounter for initial evaluation of one or more chronic problems (new to examiner) potentially causing chronic pain, and posing a threat to normal musculoskeletal function. (Level of risk: High) CC: Peripheral Neuropathy (Bilateral foot pain and bilateral hand pain)  HPI  Louis Jensen is a 68 y.o. year old, male patient, who comes for the first time to our practice referred by Louis Jannett POUR, MD for our initial evaluation of his chronic pain. He has Bilateral hearing loss; Bradycardia; Complete tear of right rotator cuff; Frequent PVCs; Injury of tendon of long head of right biceps; Hypertriglyceridemia; Morbid obesity (HCC); Need for vaccination; OSA (obstructive sleep apnea); Polyneuropathy; Prediabetes; Screening for HIV (human immunodeficiency virus); Arthritis; Arthritis of knee; Diabetic peripheral neuropathy (HCC); Diabetic polyneuropathy associated with type 2 diabetes mellitus (HCC); Ex-smoker; Irregular heart beat; Lumbar radiculopathy; Lumbosacral radiculitis; Lumbosacral spondylosis without myelopathy; Narcolepsy; Pain in right arm; Type 2 diabetes mellitus without complication, without long-term current use of  insulin (HCC); Chronic painful diabetic neuropathy (HCC); Neuropathic pain of lower extremity; and Chronic pain syndrome on their problem list. Today he comes in for evaluation of his Peripheral Neuropathy (Bilateral foot pain and bilateral hand pain)  Pain Assessment: Location: Right, Left (bilateral hand pain) Foot Radiating: denies Onset: More than a month ago Duration: Chronic pain Quality: Sharp, Burning, Numbness Severity: 6 /10 (subjective, self-reported pain score)  Effect on ADL: limits ADLS Timing: Constant Modifying factors: denies BP: (!) 138/94  HR: 70  Onset and Duration: Present longer than 3 months Cause of pain: Unknown Severity: 6 Timing: Morning, Not influenced by the time of the day, and During activity or exercise Aggravating Factors: Kneeling, Prolonged standing, and Walking Alleviating Factors: none marked Associated Problems: Fatigue and Numbness Quality of Pain: Aching, Sharp, Shooting, Toothache-like, and Uncomfortable Previous Examinations or Tests: Nerve block, Nerve conduction test, and Neurological evaluation Previous Treatments: The patient denies previous treatments  Louis Jensen is being evaluated for possible interventional pain management therapies for the treatment of his chronic pain.   Discussed the use of AI scribe software for clinical note transcription with the patient, who gave verbal consent to proceed.  History of Present Illness   Louis Jensen is a 68 year old male with diabetic neuropathy who presents for follow-up on Qutenza treatment.  He experiences burning and tingling in his feet due to diabetic neuropathy. He has undergone one Qutenza treatment previously but did not notice any significant benefit from this initial treatment.  He was due for a second Qutenza treatment but was unable to proceed due to the demands of moving house, which required him to be on his feet. During this period, he could not afford to stay off his feet  for three days as recommended post-treatment.  He recalls an incident where he mowed the lawn shortly after his  first treatment, which resulted in severe burning in his feet, teaching him to avoid such activities post-treatment in the future.  He has a history of using gabapentin at a high dose of 2400 mg, primarily taken at night, but discontinued it due to feeling unwell in the mornings. He was also prescribed another medication, possibly Lyrica or pregabalin, by a previous provider. He is not currently on insulin for his diabetes.        Meds   Current Outpatient Medications:    atorvastatin (LIPITOR) 80 MG tablet, Take 80 mg by mouth daily. , Disp: , Rfl: 3   dabigatran (PRADAXA) 150 MG CAPS capsule, Take 150 mg by mouth 2 (two) times daily., Disp: , Rfl:    diclofenac (VOLTAREN) 75 MG EC tablet, Take 75 mg by mouth 2 (two) times daily., Disp: , Rfl:    DULoxetine (CYMBALTA) 20 MG capsule, Take 20 mg by mouth., Disp: , Rfl:    metFORMIN (GLUCOPHAGE-XR) 500 MG 24 hr tablet, Take 1 tablet by mouth 2 (two) times daily., Disp: , Rfl:    metoprolol  succinate (TOPROL -XL) 25 MG 24 hr tablet, Take 1 tablet (25 mg total) by mouth daily., Disp: 90 tablet, Rfl: 0   traMADol (ULTRAM) 50 MG tablet, Take 1 tablet by mouth 2 (two) times daily., Disp: , Rfl:    gabapentin (NEURONTIN) 600 MG tablet, in the morning, at noon, in the evening, and at bedtime. (Patient not taking: Reported on 08/09/2023), Disp: , Rfl:   Imaging Review  Cervical Imaging: Cervical MR wo contrast: Results for orders placed during the hospital encounter of 02/24/17  MR CERVICAL SPINE WO CONTRAST  Narrative CLINICAL DATA:  Posterior neck pain radiating to the right arm with numbness and tingling  EXAM: MRI CERVICAL SPINE WITHOUT CONTRAST  TECHNIQUE: Multiplanar, multisequence MR imaging of the cervical spine was performed. No intravenous contrast was administered.  COMPARISON:  None.  FINDINGS: Alignment:  Physiologic.  Vertebrae: No fracture, evidence of discitis, or bone lesion.  Cord: Normal signal and morphology.  Posterior Fossa, vertebral arteries, paraspinal tissues: Posterior fossa demonstrates no focal abnormality. Vertebral artery flow voids are maintained. Paraspinal soft tissues are unremarkable.  Disc levels:  Discs: Disc spaces are maintained.  C2-3: Small central disc protrusion. Mild bilateral facet arthropathy. No foraminal stenosis. No central canal stenosis.  C3-4: No significant disc bulge. Mild bilateral facet arthropathy. Bilateral uncovertebral degenerative changes. Moderate bilateral foraminal stenosis. No central canal stenosis.  C4-5: No significant disc bulge. Moderate left facet arthropathy. No foraminal stenosis. No central canal stenosis.  C5-6: No significant disc bulge. No neural foraminal stenosis. No central canal stenosis. Mild bilateral facet arthropathy.  C6-7: Right paracentral/foraminal disc protrusion. Right uncovertebral degenerative changes. Moderate right foraminal stenosis. No left foraminal stenosis. No central canal stenosis.  C7-T1: Broad-based disc bulge. No neural foraminal stenosis. No central canal stenosis.  IMPRESSION: 1. At C6-7 there is a right paracentral/foraminal disc protrusion. Right uncovertebral degenerative changes. Moderate right foraminal stenosis.   Electronically Signed By: Julaine Blanch On: 02/24/2017 12:09   MR Shoulder Right Wo Contrast  Narrative CLINICAL DATA:  Status post fall.  Right shoulder pain.  EXAM: MRI OF THE RIGHT SHOULDER WITHOUT CONTRAST  TECHNIQUE: Multiplanar, multisequence MR imaging of the shoulder was performed. No intravenous contrast was administered.  COMPARISON:  None.  FINDINGS: Rotator cuff: Moderate tendinosis of the supraspinatus tendon with a full-thickness tear of the anterior supraspinatus tendon measuring 9 mm in anterior - posterior dimension. Infraspinatus  tendon is  intact. Teres minor tendon is intact. Moderate tendinosis of the subscapularis tendon.  Muscles: No atrophy or fatty replacement of nor abnormal signal within, the muscles of the rotator cuff.  Biceps long head:  Intact.  Acromioclavicular Joint: Moderate degenerative changes of the acromioclavicular joint. Type II acromion. No subacromial/ subdeltoid bursal fluid.  Glenohumeral Joint: No joint effusion.  No chondral defect.  Labrum: Grossly intact, but evaluation is limited by lack of intraarticular fluid.  Bones: No focal marrow signal abnormality. No fracture or dislocation.  IMPRESSION: 1. Moderate tendinosis of the supraspinatus tendon with a full-thickness tear of the anterior supraspinatus tendon measuring 9 mm in anterior - posterior dimension. 2.  Moderate tendinosis of the subscapularis tendon.   Electronically Signed By: Julaine Blanch On: 07/15/2015 08:45  DG Shoulder Left  Narrative CLINICAL DATA:  Acute onset of left upper arm and shoulder pain. Initial encounter.  EXAM: LEFT SHOULDER - 2+ VIEW  COMPARISON:  None.  FINDINGS: There is no evidence of fracture or dislocation. The left humeral head is seated within the glenoid fossa. Minimal degenerative change is noted at the left acromioclavicular joint.  There is suggestion of mild calcification at the distal insertion of the rotator cuff, which could reflect very mild calcific tendinitis. The visualized portions of the left lung are clear.  IMPRESSION: 1. No evidence of fracture or dislocation. 2. Suggestion of mild calcification at the distal insertion of the rotator cuff, which could reflect very mild calcific tendinitis.   Electronically Signed By: Juliane Chihuahua M.D. On: 12/31/2015 01:56    CT LUMBAR SPINE W CONTRAST  Narrative CLINICAL DATA:  Spinal stenosis, lumbar region, without neurogenic claudication. Right-sided low back and buttock pain. Intermittent right lower  extremity numbness.  EXAM: LUMBAR MYELOGRAM  FLUOROSCOPY: Radiation Exposure Index (as provided by the fluoroscopic device): 14.70 mGy Kerma  PROCEDURE: After thorough discussion of risks and benefits of the procedure including bleeding, infection, injury to nerves, blood vessels, adjacent structures as well as headache and CSF leak, written and oral informed consent was obtained. Consent was obtained by Dr. Dasie Hamburg. Time out form was completed.  Patient was positioned prone on the fluoroscopy table. Local anesthesia was provided with 1% lidocaine  without epinephrine  after prepped and draped in the usual sterile fashion. Puncture was performed at L4-5 using a 3 1/2 inch 22-gauge spinal needle via a left interlaminar approach. Using a single pass through the dura, the needle was placed within the thecal sac, with return of clear CSF. 15 mL of Isovue  M-200 was injected into the thecal sac, with normal opacification of the nerve roots and cauda equina consistent with free flow within the subarachnoid space.  I personally performed the lumbar puncture and administered the intrathecal contrast. I also personally supervised acquisition of the myelogram images.  TECHNIQUE: Contiguous axial images were obtained through the Lumbar spine after the intrathecal infusion of contrast. Coronal and sagittal reconstructions were obtained of the axial image sets.  COMPARISON:  Lumbar spine MRI 03/02/2022  FINDINGS: LUMBAR MYELOGRAM FINDINGS:  Vertebral alignment is normal, and no abnormal motion is evident on flexion or extension radiographs. Ventral and dorsal extradural defects at L3-4 result in moderate spinal stenosis with standing. No significant stenosis is evident elsewhere.  CT LUMBAR MYELOGRAM FINDINGS:  Vertebral alignment is normal. No fracture or suspicious osseous lesion is identified. Intervertebral disc space heights are preserved. The conus medullaris terminates at  L1. There is abdominal aortic atherosclerosis without aneurysm.  T12-L1: Negative.  L1-2: Mild facet  hypertrophy without disc herniation or stenosis, unchanged.  L2-3: Mild disc bulging and moderate facet hypertrophy without significant stenosis, unchanged.  L3-4: Disc bulging and moderate to severe facet and ligamentum flavum hypertrophy result in mild-to-moderate spinal stenosis, mild bilateral lateral recess stenosis, and mild-to-moderate bilateral neural foraminal stenosis, unchanged.  L4-5: Left eccentric disc bulging, endplate spurring, and moderate facet and ligamentum flavum hypertrophy result in mild left lateral recess stenosis and mild right and moderate left neural foraminal stenosis without significant spinal stenosis, unchanged.  L5-S1: Disc bulging, endplate spurring, and mild facet hypertrophy result in minimal to mild bilateral neural foraminal stenosis without spinal stenosis, unchanged.  IMPRESSION: 1. Unchanged lumbar spondylosis and facet arthrosis, most notable at L3-4 where there is moderate spinal stenosis with standing and mild-to-moderate neural foraminal stenosis. 2. Mild left lateral recess and mild right and moderate left neural foraminal stenosis at L4-5. 3.  Aortic Atherosclerosis (ICD10-I70.0).   Electronically Signed By: Dasie Hamburg M.D. On: 03/29/2023 12:04  DG MYELOGRAPHY LUMBAR INJ LUMBOSACRAL  Narrative CLINICAL DATA:  Spinal stenosis, lumbar region, without neurogenic claudication. Right-sided low back and buttock pain. Intermittent right lower extremity numbness.  EXAM: LUMBAR MYELOGRAM  FLUOROSCOPY: Radiation Exposure Index (as provided by the fluoroscopic device): 14.70 mGy Kerma  PROCEDURE: After thorough discussion of risks and benefits of the procedure including bleeding, infection, injury to nerves, blood vessels, adjacent structures as well as headache and CSF leak, written and oral informed consent was obtained.  Consent was obtained by Dr. Dasie Hamburg. Time out form was completed.  Patient was positioned prone on the fluoroscopy table. Local anesthesia was provided with 1% lidocaine  without epinephrine  after prepped and draped in the usual sterile fashion. Puncture was performed at L4-5 using a 3 1/2 inch 22-gauge spinal needle via a left interlaminar approach. Using a single pass through the dura, the needle was placed within the thecal sac, with return of clear CSF. 15 mL of Isovue  M-200 was injected into the thecal sac, with normal opacification of the nerve roots and cauda equina consistent with free flow within the subarachnoid space.  I personally performed the lumbar puncture and administered the intrathecal contrast. I also personally supervised acquisition of the myelogram images.  TECHNIQUE: Contiguous axial images were obtained through the Lumbar spine after the intrathecal infusion of contrast. Coronal and sagittal reconstructions were obtained of the axial image sets.  COMPARISON:  Lumbar spine MRI 03/02/2022  FINDINGS: LUMBAR MYELOGRAM FINDINGS:  Vertebral alignment is normal, and no abnormal motion is evident on flexion or extension radiographs. Ventral and dorsal extradural defects at L3-4 result in moderate spinal stenosis with standing. No significant stenosis is evident elsewhere.  CT LUMBAR MYELOGRAM FINDINGS:  Vertebral alignment is normal. No fracture or suspicious osseous lesion is identified. Intervertebral disc space heights are preserved. The conus medullaris terminates at L1. There is abdominal aortic atherosclerosis without aneurysm.  T12-L1: Negative.  L1-2: Mild facet hypertrophy without disc herniation or stenosis, unchanged.  L2-3: Mild disc bulging and moderate facet hypertrophy without significant stenosis, unchanged.  L3-4: Disc bulging and moderate to severe facet and ligamentum flavum hypertrophy result in mild-to-moderate spinal stenosis,  mild bilateral lateral recess stenosis, and mild-to-moderate bilateral neural foraminal stenosis, unchanged.  L4-5: Left eccentric disc bulging, endplate spurring, and moderate facet and ligamentum flavum hypertrophy result in mild left lateral recess stenosis and mild right and moderate left neural foraminal stenosis without significant spinal stenosis, unchanged.  L5-S1: Disc bulging, endplate spurring, and mild facet hypertrophy result in minimal to  mild bilateral neural foraminal stenosis without spinal stenosis, unchanged.  IMPRESSION: 1. Unchanged lumbar spondylosis and facet arthrosis, most notable at L3-4 where there is moderate spinal stenosis with standing and mild-to-moderate neural foraminal stenosis. 2. Mild left lateral recess and mild right and moderate left neural foraminal stenosis at L4-5. 3.  Aortic Atherosclerosis (ICD10-I70.0).   Electronically Signed By: Dasie Hamburg M.D. On: 03/29/2023 12:04    Complexity Note: Imaging results reviewed.                         ROS  Cardiovascular: Heart trouble, Abnormal heart rhythm, and Daily Aspirin intake Pulmonary or Respiratory: Snoring  and Temporary stoppage of breathing during sleep Neurological: No reported neurological signs or symptoms such as seizures, abnormal skin sensations, urinary and/or fecal incontinence, being born with an abnormal open spine and/or a tethered spinal cord Psychological-Psychiatric: No reported psychological or psychiatric signs or symptoms such as difficulty sleeping, anxiety, depression, delusions or hallucinations (schizophrenial), mood swings (bipolar disorders) or suicidal ideations or attempts Gastrointestinal: No reported gastrointestinal signs or symptoms such as vomiting or evacuating blood, reflux, heartburn, alternating episodes of diarrhea and constipation, inflamed or scarred liver, or pancreas or irrregular and/or infrequent bowel movements Genitourinary: No reported renal  or genitourinary signs or symptoms such as difficulty voiding or producing urine, peeing blood, non-functioning kidney, kidney stones, difficulty emptying the bladder, difficulty controlling the flow of urine, or chronic kidney disease Hematological: No reported hematological signs or symptoms such as prolonged bleeding, low or poor functioning platelets, bruising or bleeding easily, hereditary bleeding problems, low energy levels due to low hemoglobin or being anemic Endocrine: No reported endocrine signs or symptoms such as high or low blood sugar, rapid heart rate due to high thyroid levels, obesity or weight gain due to slow thyroid or thyroid disease Rheumatologic: No reported rheumatological signs and symptoms such as fatigue, joint pain, tenderness, swelling, redness, heat, stiffness, decreased range of motion, with or without associated rash Musculoskeletal: Negative for myasthenia gravis, muscular dystrophy, multiple sclerosis or malignant hyperthermia Work History: Retired  Allergies  Mr. Tinkham is allergic to pregabalin.  Laboratory Chemistry Profile   Renal Lab Results  Component Value Date   BUN 14 02/24/2017   CREATININE 0.81 02/24/2017   GFRAA >60 02/24/2017   GFRNONAA >60 02/24/2017   PROTEINUR NEGATIVE 05/20/2009     Electrolytes Lab Results  Component Value Date   NA 137 02/24/2017   K 3.8 02/24/2017   CL 106 02/24/2017   CALCIUM 8.9 02/24/2017     Hepatic Lab Results  Component Value Date   AST 27 05/20/2009   ALT 37 05/20/2009   ALBUMIN 4.2 05/20/2009   ALKPHOS 67 05/20/2009     ID No results found for: LYMEIGGIGMAB, HIV, SARSCOV2NAA, STAPHAUREUS, MRSAPCR, HCVAB, PREGTESTUR, RMSFIGG, QFVRPH1IGG, QFVRPH2IGG   Bone No results found for: VD25OH, CI874NY7UNU, CI6874NY7, CI7874NY7, 25OHVITD1, 25OHVITD2, 25OHVITD3, TESTOFREE, TESTOSTERONE   Endocrine Lab Results  Component Value Date   GLUCOSE 114 (H) 02/24/2017    GLUCOSEU NEGATIVE 05/20/2009     Neuropathy No results found for: VITAMINB12, FOLATE, HGBA1C, HIV   CNS No results found for: COLORCSF, APPEARCSF, RBCCOUNTCSF, WBCCSF, POLYSCSF, LYMPHSCSF, EOSCSF, PROTEINCSF, GLUCCSF, JCVIRUS, CSFOLI, IGGCSF, LABACHR, ACETBL   Inflammation (CRP: Acute  ESR: Chronic) No results found for: CRP, ESRSEDRATE, LATICACIDVEN   Rheumatology No results found for: RF, ANA, LABURIC, URICUR, LYMEIGGIGMAB, LYMEABIGMQN, HLAB27   Coagulation Lab Results  Component Value Date   INR 0.99 05/20/2009  LABPROT 13.0 05/20/2009   APTT 31 05/20/2009   PLT 246 02/24/2017     Cardiovascular Lab Results  Component Value Date   HGB 14.4 02/24/2017   HCT 44.1 02/24/2017     Screening No results found for: SARSCOV2NAA, COVIDSOURCE, STAPHAUREUS, MRSAPCR, HCVAB, HIV, PREGTESTUR   Cancer No results found for: CEA, CA125, LABCA2   Allergens No results found for: ALMOND, APPLE, ASPARAGUS, AVOCADO, BANANA, BARLEY, BASIL, BAYLEAF, GREENBEAN, LIMABEAN, WHITEBEAN, BEEFIGE, REDBEET, BLUEBERRY, BROCCOLI, CABBAGE, MELON, CARROT, CASEIN, CASHEWNUT, CAULIFLOWER, CELERY     Note: Lab results reviewed.  PFSH  Drug: Mr. Mccard  reports no history of drug use. Alcohol:  reports current alcohol use. Tobacco:  reports that he has quit smoking. His smoking use included cigarettes. He has never used smokeless tobacco. Medical:  has a past medical history of A-fib (HCC), Arrhythmia, Arthritis, Diabetes mellitus without complication (HCC), Hypercholesteremia, Hypercholesteremia, Neuropathy, and Sleep apnea. Family: family history is not on file.  Past Surgical History:  Procedure Laterality Date   APPENDECTOMY     BICEPT TENODESIS Right 08/26/2015   Procedure: BICEPS TENODESIS;  Surgeon: Norleen JINNY Maltos, MD;  Location: [facility];  Service: Orthopedics;  Laterality: Right;    CARPAL TUNNEL RELEASE Bilateral    JOINT REPLACEMENT Left    Total Knee Replacement, [facility], [place], [place]   RADIOLOGY WITH ANESTHESIA N/A 02/24/2017   Procedure: MRI WITH ANESTHESIA CERVICAL SPINE WITHOUT;  Surgeon: Radiologist, Medication, MD;  Location: [facility];  Service: Radiology;  Laterality: N/A;   SHOULDER ARTHROSCOPY WITH DEBRIDEMENT AND BICEP TENDON REPAIR Right 08/26/2015   Procedure: SHOULDER ARTHROSCOPY WITH DEBRIDEMENT ;  Surgeon: Norleen JINNY Maltos, MD;  Location: [facility];  Service: Orthopedics;  Laterality: Right;   SHOULDER ARTHROSCOPY WITH OPEN ROTATOR CUFF REPAIR Right 08/26/2015   Procedure: SHOULDER ARTHROSCOPY WITH OPEN ROTATOR CUFF REPAIR;  Surgeon: Norleen JINNY Maltos, MD;  Location: [facility];  Service: Orthopedics;  Laterality: Right;   SHOULDER ARTHROSCOPY WITH SUBACROMIAL DECOMPRESSION Right 08/26/2015   Procedure: SHOULDER ARTHROSCOPY WITH SUBACROMIAL DECOMPRESSION;  Surgeon: Norleen JINNY Maltos, MD;  Location: [facility];  Service: Orthopedics;  Laterality: Right;   TONSILLECTOMY     TOTAL KNEE ARTHROPLASTY     Active Ambulatory Problems    Diagnosis Date Noted   Bilateral hearing loss 12/30/2016   Bradycardia 11/08/2016   Complete tear of right rotator cuff 08/17/2015   Frequent PVCs 11/16/2016   Injury of tendon of long head of right biceps 08/29/2015   Hypertriglyceridemia 08/08/2012   Morbid obesity (HCC) 08/08/2012   Need for vaccination 12/30/2016   OSA (obstructive sleep apnea) 11/16/2016   Polyneuropathy 07/01/2014   Prediabetes 06/03/2014   Screening for HIV (human immunodeficiency virus) 05/25/2016   Arthritis 04/25/2023   Arthritis of knee 04/25/2023   Diabetic peripheral neuropathy (HCC) 08/04/2022   Diabetic polyneuropathy associated with type 2 diabetes mellitus (HCC) 11/06/2017   Ex-smoker 04/25/2023   Irregular heart beat 12/31/2021   Lumbar radiculopathy 10/26/2021   Lumbosacral radiculitis 07/10/2021   Lumbosacral spondylosis without myelopathy 05/04/2021    Narcolepsy 01/30/2018   Pain in right arm 04/25/2023   Type 2 diabetes mellitus without complication, without long-term current use of insulin (HCC) 06/30/2017   Chronic painful diabetic neuropathy (HCC) 08/09/2023   Neuropathic pain of lower extremity 08/09/2023   Chronic pain syndrome 08/09/2023   Resolved Ambulatory Problems    Diagnosis Date Noted   No Resolved Ambulatory Problems   Past Medical History:  Diagnosis Date   A-fib (HCC)  Arrhythmia    Diabetes mellitus without complication (HCC)    Hypercholesteremia    Hypercholesteremia    Neuropathy    Sleep apnea    Constitutional Exam  General appearance: Well nourished, well developed, and well hydrated. In no apparent acute distress Vitals:   08/09/23 0905  BP: (!) 138/94  Pulse: 70  Temp: (!) 97.3 F (36.3 C)  SpO2: 97%  Weight: 260 lb (117.9 kg)  Height: 5' 10 (1.778 m)   BMI Assessment: Estimated body mass index is 37.31 kg/m as calculated from the following:   Height as of this encounter: 5' 10 (1.778 m).   Weight as of this encounter: 260 lb (117.9 kg).  BMI interpretation table: BMI level Category Range association with higher incidence of chronic pain  <18 kg/m2 Underweight   18.5-24.9 kg/m2 Ideal body weight   25-29.9 kg/m2 Overweight Increased incidence by 20%  30-34.9 kg/m2 Obese (Class I) Increased incidence by 68%  35-39.9 kg/m2 Severe obesity (Class II) Increased incidence by 136%  >40 kg/m2 Extreme obesity (Class III) Increased incidence by 254%   Patient's current BMI Ideal Body weight  Body mass index is 37.31 kg/m. Ideal body weight: 73 kg (160 lb 15 oz) Adjusted ideal body weight: 91 kg (200 lb 9 oz)   BMI Readings from Last 4 Encounters:  08/09/23 37.31 kg/m  01/31/23 37.63 kg/m  02/24/17 35.44 kg/m  12/31/15 35.87 kg/m   Wt Readings from Last 4 Encounters:  08/09/23 260 lb (117.9 kg)  01/31/23 266 lb (120.7 kg)  02/24/17 247 lb (112 kg)  12/31/15 250 lb (113.4 kg)     Psych/Mental status: Alert, oriented x 3 (person, place, & time)       Eyes: PERLA Respiratory: No evidence of acute respiratory distress  Neuropathic pain of bilateral feet  Assessment  Primary Diagnosis & Pertinent Problem List: The primary encounter diagnosis was Chronic painful diabetic neuropathy (HCC). Diagnoses of Neuropathic pain of lower extremity and Chronic pain syndrome were also pertinent to this visit.  Visit Diagnosis (New problems to examiner): 1. Chronic painful diabetic neuropathy (HCC)   2. Neuropathic pain of lower extremity   3. Chronic pain syndrome    Plan of Care (Initial workup plan)  Patient with chronic, painful diabetic peripheral neuropathy (DPN) refractory to conservative measures including oral neuropathic agents (e.g., gabapentinoids, SNRIs) and topical therapies. Reports persistent burning, tingling, and dysesthetic pain primarily in bilateral feet, negatively impacting function and quality of life.  Plan:  Recommend initiating Qutenza (capsaicin 8%) topical patch therapy targeting distal lower extremities to reduce localized neuropathic pain. Will perform in clinic under supervision with pre-procedural topical anesthetic.  Discussed potential role for spinal cord stimulation (SCS) as a durable, non-opioid neuromodulatory treatment option for painful diabetic neuropathy. Given severity and refractoriness, patient may be an appropriate candidate.  Will proceed with Qutenza treatment and assess response. If inadequate relief, consider SCS trial in collaboration with neurosurgery or neuromodulation team.  Continue routine diabetes management in coordination with PCP/endocrinology.  Follow up in 2-4 weeks post-Qutenza to evaluate efficacy and tolerance.   Procedure Orders         NEUROLYSIS      Interventional management options: Mr. Meeker was informed that there is no guarantee that he would be a candidate for interventional therapies. The  decision will be based on the results of diagnostic studies, as well as Mr. Dauzat risk profile.  Procedure(s) under consideration:  SCS  Provider-requested follow-up: Return in about 8 days (around 08/17/2023)  for Qutenza .  Future Appointments  Date Time Provider Department Center  08/11/2023  9:15 AM Loistine Sober, NP [facility] None  08/17/2023  2:00 PM Marcelino Nurse, MD [facility] None  11/09/2023 10:45 AM Hyatt, Royden DASEN, DPM [facility]   I discussed the assessment and treatment plan with the patient. The patient was provided an opportunity to ask questions and all were answered. The patient agreed with the plan and demonstrated an understanding of the instructions.  Patient advised to call back or seek an in-person evaluation if the symptoms or condition worsens.  Duration of encounter: .  Total time on encounter, as per AMA guidelines included both the face-to-face and non-face-to-face time personally spent by the physician and/or other qualified health care professional(s) on the day of the encounter (includes time in activities that require the physician or other qualified health care professional and does not include time in activities normally performed by clinical staff). Physician's time may include the following activities when performed: Preparing to see the patient (e.g., pre-charting review of records, searching for previously ordered imaging, lab work, and nerve conduction tests) Review of prior analgesic pharmacotherapies. Reviewing PMP Interpreting ordered tests (e.g., lab work, imaging, nerve conduction tests) Performing post-procedure evaluations, including interpretation of diagnostic procedures Obtaining and/or reviewing separately obtained history Performing a medically appropriate examination and/or evaluation Counseling and educating the patient/family/caregiver Ordering medications, tests, or procedures Referring and communicating with other  health care professionals (when not separately reported) Documenting clinical information in the electronic or other health record Independently interpreting results (not separately reported) and communicating results to the patient/ family/caregiver Care coordination (not separately reported)  Note by: Nurse Marcelino, MD (TTS and AI technology used. I apologize for any typographical errors that were not detected and corrected.) Date: 08/09/2023; Time: 9:55 AM

## 2023-08-09 NOTE — Progress Notes (Unsigned)
 Cardiology Clinic Note   Date: 08/11/2023 ID: Sparrow, Sanzo [birth year], MRN [id number]  Primary Cardiologist:  Evalene Lunger, MD  Chief Complaint   Louis Jensen is a 68 y.o. male who presents to the clinic today for routine follow up.   Patient Profile   Louis Jensen is followed by Dr. Gollan for the history outlined below.      Past medical history significant for: Permanent A-fib. Onset 2023. Bradycardia/frequent PVCs. Hyperlipidemia. Lipid panel 05/26/2023: LDL 101, HDL 33, TG 152, total 164. OSA. T2DM. Chronic pain syndrome.  In summary, patient was first evaluated by Dr. Gollan on 01/31/2023 for A-fib.  Patient reported history of frequent PVCs, bradycardia, permanent A-fib.  He was to undergo knee surgery in 2022 in [place], [place]  but it was canceled secondary to bradycardia.  He established care with cardiology and was cleared for surgery in 2023.  At some point in 2023 he was diagnosed with A-fib but never saw EP before moving to the area.  He was anticoagulated with Pradaxa for stroke prophylaxis.  He reported being in A-fib for over a year with minimal symptoms other than some mild fatigue.  EKG at the time of his visit showed A-fib HR 85 bpm.  He reported undergoing echo and stress testing 6 months prior in [place] and records were requested.  Echo performed in January 2024 demonstrated EF > 55%, no significant valvular abnormalities, normal RV size/function.  Lexiscan was normal with no evidence of ischemia or prior infarction.     History of Present Illness    Today, patient reports he is doing well. He has moved into his home from the apartment he and his wife were living in when they first moved from [facility]. Patient denies shortness of breath, dyspnea on exertion, lower extremity edema, orthopnea or PND. No chest pain, pressure, or tightness. No palpitations. He has no cardiac awareness of arrhythmia. Right before he moved to [place] he was going to be  evaluated by EP for his afib. He reports occasional dizziness when he drops his head down toward the ground then straightens back up. He stays active by performing light exercises and stretching at home. He denies blood in urine or stool.     ROS: All other systems reviewed and are otherwise negative except as noted in History of Present Illness.  EKGs/Labs Reviewed    EKG Interpretation Date/Time:  Thursday August 11 2023 09:43:51 EDT Ventricular Rate:  91 PR Interval:    QRS Duration:  88 QT Interval:  370 QTC Calculation: 455 R Axis:   21  Text Interpretation: Atrial fibrillation Septal infarct , age undetermined When compared with ECG of 31-Jan-2023 09:36, No significant change Confirmed by Loistine Sober [phone]) on 08/11/2023 9:50:21 AM    Risk Assessment/Calculations     CHA2DS2-VASc Score = 3   This indicates a 3.2% annual risk of stroke. The patient's score is based upon: CHF History: 0 HTN History: 1 Diabetes History: 1 Stroke History: 0 Vascular Disease History: 0 Age Score: 1 Gender Score: 0             Physical Exam    VS:  BP 124/88   Pulse 100   Ht 5' 10 (1.778 m)   Wt 261 lb 9.6 oz (118.7 kg)   SpO2 98%   BMI 37.54 kg/m  , BMI Body mass index is 37.54 kg/m.  GEN: Well nourished, well developed, in no acute distress. Neck: No JVD or carotid bruits.  Cardiac: Irregularly irregular rhythm.  No murmur. No rubs or gallops.   Respiratory:  Respirations regular and unlabored. Clear to auscultation without rales, wheezing or rhonchi. GI: Soft, nontender, nondistended. Extremities: Radials/DP/PT 2+ and equal bilaterally. No clubbing or cyanosis. No edema.  Skin: Warm and dry, no rash. Neuro: Strength intact.  Assessment & Plan   Permanent A-fib Onset 2023.  Echo January 2024 demonstrated normal LV/RV function.  Lexiscan was normal with no evidence of ischemia or prior infarction.  Denies spontaneous bleeding concerns.  Patient denies cardiac  awareness of arrhythmia. He has never undergone cardioversion. He was supposed to be evaluated by EP in [facility] but everything went through with his move to [place] so he did not make the appointment.  EKG shows afib 91 bpm. He is interested in seeing EP.  - Refer to EP.  - Continue Toprol , Pradaxa.  Bradycardia/frequent PVCs Patient reports he has a history of his heart rate dropping under anesthesia. He is not aware of PVCs. No ectopy seen on EKG today.  - Continue Toprol .  Hyperlipidemia LDL 101 April 2025. - Continue atorvastatin.  OSA Patient reports 100% compliance with CPAP.  - Encouraged to continue CPAP.   Disposition: Refer to EP. Return in 1 year or sooner as needed.          Signed, Barnie HERO. Timberly Yott, DNP, NP-C

## 2023-08-11 ENCOUNTER — Ambulatory Visit: Attending: Student | Admitting: Student

## 2023-08-11 ENCOUNTER — Encounter: Payer: Self-pay | Admitting: Student

## 2023-08-11 VITALS — BP 124/88 | HR 100 | Ht 70.0 in | Wt 261.6 lb

## 2023-08-11 DIAGNOSIS — I493 Ventricular premature depolarization: Secondary | ICD-10-CM | POA: Diagnosis present

## 2023-08-11 DIAGNOSIS — E782 Mixed hyperlipidemia: Secondary | ICD-10-CM | POA: Insufficient documentation

## 2023-08-11 DIAGNOSIS — G4733 Obstructive sleep apnea (adult) (pediatric): Secondary | ICD-10-CM | POA: Diagnosis present

## 2023-08-11 DIAGNOSIS — I4821 Permanent atrial fibrillation: Secondary | ICD-10-CM | POA: Diagnosis present

## 2023-08-11 DIAGNOSIS — R001 Bradycardia, unspecified: Secondary | ICD-10-CM | POA: Insufficient documentation

## 2023-08-11 NOTE — Patient Instructions (Addendum)
 Medication Instructions:   Your physician recommends that you continue on your current medications as directed. Please refer to the Current Medication list given to you today.   *If you need a refill on your cardiac medications before your next appointment, please call your pharmacy*  Lab Work:  No labs ordered today   If you have labs (blood work) drawn today and your tests are completely normal, you will receive your results only by: MyChart Message (if you have MyChart) OR A paper copy in the mail If you have any lab test that is abnormal or we need to change your treatment, we will call you to review the results.  Testing/Procedures:  No test ordered today   Follow-Up: At [facility], you and your health needs are our priority.  As part of our continuing mission to provide you with exceptional heart care, our providers are all part of one team.  This team includes your primary Cardiologist (physician) and Advanced Practice Providers or APPs (Physician Assistants and Nurse Practitioners) who all work together to provide you with the care you need, when you need it.  Referral for EP Appointment  Your next appointment:    1 year(s)  Provider:   You may see Timothy Gollan, MD or one of the following Advanced Practice Providers on your designated Care Team:    Barnie Hila, NP   We recommend signing up for the patient portal called MyChart.  Sign up information is provided on this After Visit Summary.  MyChart is used to connect with patients for Virtual Visits (Telemedicine).  Patients are able to view lab/test results, encounter notes, upcoming appointments, etc.  Non-urgent messages can be sent to your provider as well.   To learn more about what you can do with MyChart, go to [email or web address].

## 2023-08-17 ENCOUNTER — Ambulatory Visit: Admitting: Student in an Organized Health Care Education/Training Program

## 2023-08-22 ENCOUNTER — Ambulatory Visit: Admitting: Student in an Organized Health Care Education/Training Program

## 2023-09-05 ENCOUNTER — Encounter: Payer: Self-pay | Admitting: Student in an Organized Health Care Education/Training Program

## 2023-09-05 ENCOUNTER — Ambulatory Visit
Attending: Student in an Organized Health Care Education/Training Program | Admitting: Student in an Organized Health Care Education/Training Program

## 2023-09-05 VITALS — BP 134/91 | HR 84 | Temp 97.3°F | Resp 16 | Ht 70.0 in | Wt 260.0 lb

## 2023-09-05 DIAGNOSIS — E1140 Type 2 diabetes mellitus with diabetic neuropathy, unspecified: Secondary | ICD-10-CM | POA: Insufficient documentation

## 2023-09-05 MED ORDER — CAPSAICIN-CLEANSING GEL 8 % EX KIT
4.0000 | PACK | Freq: Once | CUTANEOUS | Status: AC
  Administered 2023-09-05: 4 via TOPICAL
  Filled 2023-09-05: qty 4

## 2023-09-05 NOTE — Progress Notes (Signed)
 PROVIDER NOTE: Interpretation of information contained herein should be left to medically-trained personnel. Specific patient instructions are provided elsewhere under Patient Instructions section of medical record. This document was created in part using STT-dictation technology, any transcriptional errors that may result from this process are unintentional.  Patient: Louis Jensen Type: Established DOB: May 28, 1955 MRN: [id number] PCP: Eliverto Bette Hover, MD  Service: Procedure DOS: 09/05/2023 Setting: Ambulatory Location: Ambulatory outpatient facility Delivery: Face-to-face Provider: Wallie Sherry, MD Specialty: Interventional Pain Management Specialty designation: 09 Location: Outpatient facility Ref. Prov.: Eliverto Bette Hover, *       Interventional Therapy   Type: Qutenza  Neurolysis #1  Laterality:  Bilateral Area treated: Feet Imaging Guidance: None Anesthesia/analgesia/anxiolysis/sedation: None required Medication (Right): Qutenza  (capsaicin  8%) topical system Medication (Left): Qutenza  (capsaicin  8%) topical system Date: 09/05/2023 Performed by: Wallie Sherry, MD Rationale (medical necessity): procedure needed and proper for the treatment of Louis Jensen medical symptoms and needs. Indication: Painful diabetic peripheral neuralgia (DPN) (ICD-10-CM:E11.40) severe enough to impact quality of life or function. 1. Chronic painful diabetic neuropathy (HCC)    NAS-11 Pain score:   Pre-procedure: 6 /10   Post-procedure: 6 /10     Position / Prep / Materials:  Position: Supine  Materials: Qutenza  Kit (4 patches)  H&P (Pre-op Assessment):  Louis Jensen is a 68 y.o. (year old), male patient, seen today for interventional treatment. He  has a past surgical history that includes Total knee arthroplasty; Tonsillectomy; Joint replacement (Left); Carpal tunnel release (Bilateral); Shoulder arthroscopy with debridement and bicep tendon repair (Right, 08/26/2015); Shoulder  arthroscopy with open rotator cuff repair (Right, 08/26/2015); Shoulder arthroscopy with subacromial decompression (Right, 08/26/2015); Bicept tenodesis (Right, 08/26/2015); Appendectomy; and Radiology with anesthesia (N/A, 02/24/2017). Louis Jensen has a current medication list which includes the following prescription(s): atorvastatin, dabigatran , diclofenac, duloxetine, metformin, metoprolol  succinate, tramadol, and gabapentin. His primarily concern today is the Foot Pain (bilateral)  Initial Vital Signs:  Pulse/HCG Rate: 84  Temp: (!) 97.3 F (36.3 C) Resp: 16 BP: (!) 134/91 SpO2: 97 %  BMI: Estimated body mass index is 37.31 kg/m as calculated from the following:   Height as of this encounter: 5' 10 (1.778 m).   Weight as of this encounter: 260 lb (117.9 kg).  Risk Assessment: Allergies: Reviewed. He is allergic to pregabalin.  Allergy Precautions: None required Coagulopathies: Reviewed. None identified.  Blood-thinner therapy: None at this time Active Infection(s): Reviewed. None identified. Louis Jensen is afebrile  Site Confirmation: Louis Jensen was asked to confirm the procedure and laterality before marking the site Procedure checklist: Completed Consent: Before the procedure and under the influence of no sedative(s), amnesic(s), or anxiolytics, the patient was informed of the treatment options, risks and possible complications. To fulfill our ethical and legal obligations, as recommended by the American Medical Association's Code of Ethics, I have informed the patient of my clinical impression; the nature and purpose of the treatment or procedure; the risks, benefits, and possible complications of the intervention; the alternatives, including doing nothing; the risk(s) and benefit(s) of the alternative treatment(s) or procedure(s); and the risk(s) and benefit(s) of doing nothing. The patient was provided information about the general risks and possible complications associated with the  procedure. These may include, but are not limited to: failure to achieve desired goals, infection, bleeding, organ or nerve damage, allergic reactions, paralysis, and death. In addition, the patient was informed of those risks and complications associated to the procedure, such as failure to decrease pain; infection; bleeding; organ or nerve damage  with subsequent damage to sensory, motor, and/or autonomic systems, resulting in permanent pain, numbness, and/or weakness of one or several areas of the body; allergic reactions; (i.e.: anaphylactic reaction); and/or death. Furthermore, the patient was informed of those risks and complications associated with the medications. These include, but are not limited to: allergic reactions (i.e.: anaphylactic or anaphylactoid reaction(s)); adrenal axis suppression; blood sugar elevation that in diabetics may result in ketoacidosis or comma; water retention that in patients with history of congestive heart failure may result in shortness of breath, pulmonary edema, and decompensation with resultant heart failure; weight gain; swelling or edema; medication-induced neural toxicity; particulate matter embolism and blood vessel occlusion with resultant organ, and/or nervous system infarction; and/or aseptic necrosis of one or more joints. Finally, the patient was informed that Medicine is not an exact science; therefore, there is also the possibility of unforeseen or unpredictable risks and/or possible complications that may result in a catastrophic outcome. The patient indicated having understood very clearly. We have given the patient no guarantees and we have made no promises. Enough time was given to the patient to ask questions, all of which were answered to the patient's satisfaction. Louis Jensen has indicated that he wanted to continue with the procedure. Attestation: I, the ordering provider, attest that I have discussed with the patient the benefits, risks,  side-effects, alternatives, likelihood of achieving goals, and potential problems during recovery for the procedure that I have provided informed consent. Date  Time: 09/05/2023  9:15 AM  Pre-Procedure Preparation:  Monitoring: As per clinic protocol. Respiration, ETCO2, SpO2, BP, heart rate and rhythm monitor placed and checked for adequate function Safety Precautions: Patient was assessed for positional comfort and pressure points before starting the procedure. Time-out: I initiated and conducted the Time-out before starting the procedure, as per protocol. The patient was asked to participate by confirming the accuracy of the Time Out information. Verification of the correct person, site, and procedure were performed and confirmed by me, the nursing staff, and the patient. Time-out conducted as per Joint Commission's Universal Protocol (UP.01.01.01). Time: 0931 Start Time: 0931 hrs.  Description/Narrative of Procedure:          Region: Distal lower extremities Target Area: Sensory peripheral nerves affected by diabetic peripheral neuropathy Site: Feet Approach: Percutaneous  No./Series: Not applicable  Type: Percutaneous  Purpose: Therapeutic  Region: Distal lower extremities  Start Time: 0931 hrs.  Description of the Procedure: Protocol guidelines were followed. The patient was assisted into a comfortable position.  Informed consent was obtained in the patient monitored in the usual manner.  All questions were answered prior to the procedure.  They Qutenza  patches were applied to the affected area and then covered with the wrap.  The Patient was kept under observation until the treatment was completed.  The patches were removed and the treated area was inspected.  Vitals:   09/05/23 0914  BP: (!) 134/91  Pulse: 84  Resp: 16  Temp: (!) 97.3 F (36.3 C)  TempSrc: Temporal  SpO2: 97%  Weight: 260 lb (117.9 kg)  Height: 5' 10 (1.778 m)     End Time: 1015 hrs.  Type of  Imaging Technique: None used Indication(s): N/A Exposure Time: No patient exposure Contrast: None used. Fluoroscopic Guidance: N/A Ultrasound Guidance: N/A Interpretation: N/A  Post-operative Assessment:  Post-procedure Vital Signs:  Pulse/HCG Rate: 84  Temp: (!) 97.3 F (36.3 C) Resp: 16 BP: (!) 134/91 SpO2: 97 %  EBL: None  Complications: No immediate post-treatment complications observed  by team, or reported by patient.  Note: The patient tolerated the entire procedure well. A repeat set of vitals were taken after the procedure and the patient was kept under observation following institutional policy, for this type of procedure. Post-procedural neurological assessment was performed, showing return to baseline, prior to discharge. The patient was provided with post-procedure discharge instructions, including a section on how to identify potential problems. Should any problems arise concerning this procedure, the patient was given instructions to immediately contact us , at any time, without hesitation. In any case, we plan to contact the patient by telephone for a follow-up status report regarding this interventional procedure.  Comments:  No additional relevant information.  Plan of Care (POC)  Orders:  No orders of the defined types were placed in this encounter.    Medications ordered for procedure: Meds ordered this encounter  Medications   capsaicin  topical system 8 % patch 4 patch    Disregard order if transmitted to pharmacy. Qutenza  Kit to be used from [facility].   Medications administered: We administered capsaicin  topical system.  See the medical record for exact dosing, route, and time of administration.    Follow-up plan:   Return in about 9 weeks (around 11/07/2023) for PPE, F2F.     Recent Visits Date Type Provider Dept  08/09/23 Office Visit Marcelino Nurse, MD [facility]  Showing recent visits within past 90 days and meeting  all other requirements Today's Visits Date Type Provider Dept  09/05/23 Procedure visit Marcelino Nurse, MD [facility]  Showing today's visits and meeting all other requirements Future Appointments Date Type Provider Dept  11/08/23 Appointment Marcelino Nurse, MD [facility]  Showing future appointments within next 90 days and meeting all other requirements   Disposition: Discharge home  Discharge (Date  Time): 09/05/2023; 1023 hrs.   Primary Care Physician: Eliverto Bette Hover, MD Location: [facility] Note by: Nurse Marcelino, MD (TTS technology used. I apologize for any typographical errors that were not detected and corrected.) Date: 09/05/2023; Time: 10:49 AM  Disclaimer:  Medicine is not an Visual merchandiser. The only guarantee in medicine is that nothing is guaranteed. It is important to note that the decision to proceed with this intervention was based on the information collected from the patient. The Data and conclusions were drawn from the patient's questionnaire, the interview, and the physical examination. Because the information was provided in large part by the patient, it cannot be guaranteed that it has not been purposely or unconsciously manipulated. Every effort has been made to obtain as much relevant data as possible for this evaluation. It is important to note that the conclusions that lead to this procedure are derived in large part from the available data. Always take into account that the treatment will also be dependent on availability of resources and existing treatment guidelines, considered by other Pain Management Practitioners as being common knowledge and practice, at the time of the intervention. For Medico-Legal purposes, it is also important to point out that variation in procedural techniques and pharmacological choices are the acceptable norm. The indications, contraindications, technique, and results of the above  procedure should only be interpreted and judged by a Board-Certified Interventional Pain Specialist with extensive familiarity and expertise in the same exact procedure and technique.

## 2023-09-06 ENCOUNTER — Encounter: Payer: Self-pay | Admitting: Cardiovascular Disease

## 2023-09-06 ENCOUNTER — Other Ambulatory Visit: Payer: Self-pay | Admitting: Emergency Medicine

## 2023-09-06 ENCOUNTER — Telehealth: Payer: Self-pay | Admitting: *Deleted

## 2023-09-06 NOTE — H&P (View-Only) (Signed)
  Electrophysiology Office Note:    Date:  09/07/2023   ID:  Louis Jensen, Louis Jensen Nov 22, 1955, MRN [id number]  [facility] Cardiologist:  Evalene Lunger, MD  [facility] Electrophysiologist:  OLE ONEIDA HOLTS, MD   Referring MD: Loistine Sober, NP   Chief Complaint: Atrial fibrillation  History of Present Illness:    Louis Jensen is a 68 year old man who I am seeing today for an evaluation of atrial fibrillation at the request of Sober Loistine.  The patient saw Sober August 11, 2023. The patient has a documented history of permanent atrial fibrillation, frequent PVCs, diabetes, sleep apnea, chronic pain.  He previously was going to be evaluated for his atrial fibrillation by an electrophysiologist in [place] .  He has subsequently relocated to [place] .  Based on records his atrial fibrillation started in 2023.  He has never had a cardioversion.  The patient recently moved from [facility] .  He was diagnosed in October 2023 while on the operating room table for knee replacement.  The surgery was canceled.  He was referred to a cardiologist.  He was almost getting set up to see an electrophysiologist but ultimately relocated to [place]  and had to cancel his appointments.  He cannot tell that he is in atrial fibrillation.  He does report ongoing shortness of breath with exertion.  He is not sure what is causing this.  He does tell me that he has missed the morning dose of dabigatran  frequently recently.  He is running out of his medication and needs refills.    Their past medical, social and family history was reviewed.   ROS:   Please see the history of present illness.    All other systems reviewed and are negative.  EKGs/Labs/Other Studies Reviewed:    The following studies were reviewed today:  August 11, 2023 EKG shows atrial fibrillation       Physical Exam:    VS:  BP 124/72 (BP Location: Left Arm, Patient Position: Sitting,  Cuff Size: Normal)   Pulse 88   Ht 5' 10 (1.778 m)   Wt 261 lb (118.4 kg)   SpO2 97%   BMI 37.45 kg/m     Wt Readings from Last 3 Encounters:  09/07/23 261 lb (118.4 kg)  09/05/23 260 lb (117.9 kg)  08/11/23 261 lb 9.6 oz (118.7 kg)     GEN: no distress.  Obese CARD: Irregularly irregular, No MRG RESP: No IWOB. CTAB.        ASSESSMENT AND PLAN:    1. Longstanding persistent atrial fibrillation (HCC)     #Atrial fibrillation Continue dabigatran  twice daily for stroke prophylaxis Continue metoprolol   Unclear chronicity of his atrial fibrillation.  He appears to be asymptomatic.  Given unclear pattern and chronicity of his atrial fibrillation, I would like to start with a TEE guided cardioversion.  I discussed the TEE and cardioversion procedure in detail with the patient including the risk of stroke and he wishes to proceed.    We will get this scheduled.  If he is able to maintain sinus rhythm, can consider rhythm control strategies such as catheter ablation or antiarrhythmic drugs.  Follow-up with me in 6 to 8 weeks after cardioversion.     Signed, OLE ONEIDA. HOLTS, MD, [facility], [facility] 09/07/2023 9:38 AM    Electrophysiology [facility]

## 2023-09-06 NOTE — Progress Notes (Unsigned)
  Electrophysiology Office Note:    Date:  09/07/2023   ID:  [name], [name] Nov 22, 1955, MRN [id number]  [facility] Cardiologist:  Evalene Lunger, MD  [facility] Electrophysiologist:  OLE ONEIDA HOLTS, MD   Referring MD: Loistine Sober, NP   Chief Complaint: Atrial fibrillation  History of Present Illness:    Mr. Louis Jensen is a 68 year old man who I am seeing today for an evaluation of atrial fibrillation at the request of Sober Loistine.  The patient saw Sober August 11, 2023. The patient has a documented history of permanent atrial fibrillation, frequent PVCs, diabetes, sleep apnea, chronic pain.  He previously was going to be evaluated for his atrial fibrillation by an electrophysiologist in [place] .  He has subsequently relocated to [place] .  Based on records his atrial fibrillation started in 2023.  He has never had a cardioversion.  The patient recently moved from [facility] .  He was diagnosed in October 2023 while on the operating room table for knee replacement.  The surgery was canceled.  He was referred to a cardiologist.  He was almost getting set up to see an electrophysiologist but ultimately relocated to [place]  and had to cancel his appointments.  He cannot tell that he is in atrial fibrillation.  He does report ongoing shortness of breath with exertion.  He is not sure what is causing this.  He does tell me that he has missed the morning dose of dabigatran  frequently recently.  He is running out of his medication and needs refills.    Their past medical, social and family history was reviewed.   ROS:   Please see the history of present illness.    All other systems reviewed and are negative.  EKGs/Labs/Other Studies Reviewed:    The following studies were reviewed today:  August 11, 2023 EKG shows atrial fibrillation       Physical Exam:    VS:  BP 124/72 (BP Location: Left Arm, Patient Position: Sitting,  Cuff Size: Normal)   Pulse 88   Ht 5' 10 (1.778 m)   Wt 261 lb (118.4 kg)   SpO2 97%   BMI 37.45 kg/m     Wt Readings from Last 3 Encounters:  09/07/23 261 lb (118.4 kg)  09/05/23 260 lb (117.9 kg)  08/11/23 261 lb 9.6 oz (118.7 kg)     GEN: no distress.  Obese CARD: Irregularly irregular, No MRG RESP: No IWOB. CTAB.        ASSESSMENT AND PLAN:    1. Longstanding persistent atrial fibrillation (HCC)     #Atrial fibrillation Continue dabigatran  twice daily for stroke prophylaxis Continue metoprolol   Unclear chronicity of his atrial fibrillation.  He appears to be asymptomatic.  Given unclear pattern and chronicity of his atrial fibrillation, I would like to start with a TEE guided cardioversion.  I discussed the TEE and cardioversion procedure in detail with the patient including the risk of stroke and he wishes to proceed.    We will get this scheduled.  If he is able to maintain sinus rhythm, can consider rhythm control strategies such as catheter ablation or antiarrhythmic drugs.  Follow-up with me in 6 to 8 weeks after cardioversion.     Signed, OLE ONEIDA. HOLTS, MD, [facility], [facility] 09/07/2023 9:38 AM    Electrophysiology [facility]

## 2023-09-06 NOTE — Telephone Encounter (Signed)
 Attempted to call for post procedure follow-up. Message left.

## 2023-09-06 NOTE — Progress Notes (Unsigned)
 Medication ordered pending for Dr. Gollan.

## 2023-09-07 ENCOUNTER — Encounter: Payer: Self-pay | Admitting: Cardiology

## 2023-09-07 ENCOUNTER — Ambulatory Visit: Attending: Cardiology | Admitting: Cardiology

## 2023-09-07 ENCOUNTER — Other Ambulatory Visit: Payer: Self-pay

## 2023-09-07 VITALS — BP 124/72 | HR 88 | Ht 70.0 in | Wt 261.0 lb

## 2023-09-07 DIAGNOSIS — I4811 Longstanding persistent atrial fibrillation: Secondary | ICD-10-CM | POA: Diagnosis present

## 2023-09-07 MED ORDER — DABIGATRAN ETEXILATE MESYLATE 150 MG PO CAPS: 150.0000 mg | ORAL_CAPSULE | Freq: Two times a day (BID) | ORAL | 3 refills | Status: AC

## 2023-09-07 NOTE — Patient Instructions (Signed)
 Medication Instructions:  Your physician recommends that you continue on your current medications as directed. Please refer to the Current Medication list given to you today.  *If you need a refill on your cardiac medications before your next appointment, please call your pharmacy*  Lab Work: TODAY: BMET and CBC   Testing/Procedures: TEE/Cardioversion  Your physician has requested that you have a TEE/Cardioversion. During a TEE, sound waves are used to create images of your heart. It provides your doctor with information about the size and shape of your heart and how well your heart's chambers and valves are working. In this test, a transducer is attached to the end of a flexible tube that is guided down you throat and into your esophagus (the tube leading from your mouth to your stomach) to get a more detailed image of your heart. Once the TEE has determined that a blood clot is not present, the cardioversion begins. Electrical Cardioversion uses a jolt of electricity to your heart either through paddles or wired patches attached to your chest. This is a controlled, usually prescheduled, procedure. This procedure is done at the hospital and you are not awake during the procedure. You usually go home the day of the procedure. Please see the instruction sheet given to you today for more information.   Follow-Up: At [facility], you and your health needs are our priority.  As part of our continuing mission to provide you with exceptional heart care, our providers are all part of one team.  This team includes your primary Cardiologist (physician) and Advanced Practice Providers or APPs (Physician Assistants and Nurse Practitioners) who all work together to provide you with the care you need, when you need it.  Your next appointment:   6-8 weeks  Provider:   Ole Holts, MD

## 2023-09-08 ENCOUNTER — Other Ambulatory Visit: Payer: Self-pay | Admitting: Cardiovascular Disease

## 2023-09-08 LAB — CBC
Hematocrit: 48.9 % (ref 37.5–51.0)
Hemoglobin: 15.6 g/dL (ref 13.0–17.7)
MCH: 26.9 pg (ref 26.6–33.0)
MCHC: 31.9 g/dL (ref 31.5–35.7)
MCV: 85 fL (ref 79–97)
Platelets: 255 x10E3/uL (ref 150–450)
RBC: 5.79 x10E6/uL (ref 4.14–5.80)
RDW: 14.4 % (ref 11.6–15.4)
WBC: 9.3 x10E3/uL (ref 3.4–10.8)

## 2023-09-08 LAB — BASIC METABOLIC PANEL WITH GFR
BUN/Creatinine Ratio: 17 (ref 10–24)
BUN: 14 mg/dL (ref 8–27)
CO2: 21 mmol/L (ref 20–29)
Calcium: 9.2 mg/dL (ref 8.6–10.2)
Chloride: 101 mmol/L (ref 96–106)
Creatinine, Ser: 0.81 mg/dL (ref 0.76–1.27)
Glucose: 122 mg/dL — ABNORMAL HIGH (ref 70–99)
Potassium: 4.4 mmol/L (ref 3.5–5.2)
Sodium: 138 mmol/L (ref 134–144)
eGFR: 95 mL/min/1.73 (ref >59)

## 2023-09-12 ENCOUNTER — Telehealth: Payer: Self-pay | Admitting: Cardiovascular Disease

## 2023-09-12 NOTE — Telephone Encounter (Signed)
 Called and spoke with the patient. Patient was concerned about transportation back after his procedure appointment on Thursday, 09/15/23. Pt's wife has not been cleared to drive after her procedure, so the patient was planning on driving himself to the procedure and have the staff call his mother-in-law, who is local, to come and pick him up after the procedure. Patient was advised that this was fine and should not prevent him from getting the procedure done. Patient verbalized understanding with all questions and concerns addressed at this time.

## 2023-09-12 NOTE — Telephone Encounter (Signed)
 Pt called in stated that he does not have a ride home from his procedure On thurs.  He wants to know how important it is to have a ride and or can he reschedule this appt?    [phone]

## 2023-09-13 ENCOUNTER — Telehealth: Payer: Self-pay | Admitting: *Deleted

## 2023-09-13 NOTE — Telephone Encounter (Signed)
 Spoke with patient and reviewed all instructions.   Called to confirm/remind patient of their procedure.   Scheduled for: TEE/DCCV  [x]  Date 09/15/23  [x]  Time 07:30 am [x]  Arrival time 06:30 am [x]  Location [facility]   [x]  Designated Driver  [x]  Instructions, time, and location reviewed with patient    [x]  H&P within 30 days  [x]  EKG within 30 days  [x]  Orders  [x]  Labs

## 2023-09-14 MED ORDER — SODIUM CHLORIDE 0.9 % IV SOLN: INTRAVENOUS | Status: DC

## 2023-09-15 ENCOUNTER — Ambulatory Visit (HOSPITAL_BASED_OUTPATIENT_CLINIC_OR_DEPARTMENT_OTHER)
Admission: RE | Admit: 2023-09-15 | Discharge: 2023-09-15 | Disposition: A | Discharge status: Home / Self Care | Source: Home / Self Care | Attending: Cardiovascular Disease | Admitting: Cardiovascular Disease

## 2023-09-15 ENCOUNTER — Encounter: Payer: Self-pay | Admitting: Cardiovascular Disease

## 2023-09-15 ENCOUNTER — Encounter
Admission: RE | Disposition: A | Discharge status: Home / Self Care | Payer: Self-pay | Source: Home / Self Care | Attending: Cardiovascular Disease

## 2023-09-15 ENCOUNTER — Ambulatory Visit: Admitting: Anesthesiology

## 2023-09-15 ENCOUNTER — Ambulatory Visit
Admission: RE | Admit: 2023-09-15 | Discharge: 2023-09-15 | Disposition: A | Discharge status: Home / Self Care | Attending: Cardiovascular Disease | Admitting: Cardiovascular Disease

## 2023-09-15 DIAGNOSIS — I340 Nonrheumatic mitral (valve) insufficiency: Secondary | ICD-10-CM | POA: Diagnosis not present

## 2023-09-15 DIAGNOSIS — Z79899 Other long term (current) drug therapy: Secondary | ICD-10-CM | POA: Insufficient documentation

## 2023-09-15 DIAGNOSIS — G4730 Sleep apnea, unspecified: Secondary | ICD-10-CM | POA: Insufficient documentation

## 2023-09-15 DIAGNOSIS — I361 Nonrheumatic tricuspid (valve) insufficiency: Secondary | ICD-10-CM | POA: Diagnosis not present

## 2023-09-15 DIAGNOSIS — I4811 Longstanding persistent atrial fibrillation: Secondary | ICD-10-CM | POA: Insufficient documentation

## 2023-09-15 DIAGNOSIS — Z87891 Personal history of nicotine dependence: Secondary | ICD-10-CM | POA: Diagnosis not present

## 2023-09-15 DIAGNOSIS — Z01818 Encounter for other preprocedural examination: Secondary | ICD-10-CM

## 2023-09-15 DIAGNOSIS — G8929 Other chronic pain: Secondary | ICD-10-CM | POA: Insufficient documentation

## 2023-09-15 DIAGNOSIS — I700 Atherosclerosis of aorta: Secondary | ICD-10-CM | POA: Insufficient documentation

## 2023-09-15 DIAGNOSIS — I4819 Other persistent atrial fibrillation: Secondary | ICD-10-CM

## 2023-09-15 DIAGNOSIS — Z7901 Long term (current) use of anticoagulants: Secondary | ICD-10-CM | POA: Insufficient documentation

## 2023-09-15 DIAGNOSIS — R0602 Shortness of breath: Secondary | ICD-10-CM

## 2023-09-15 DIAGNOSIS — E119 Type 2 diabetes mellitus without complications: Secondary | ICD-10-CM | POA: Diagnosis not present

## 2023-09-15 DIAGNOSIS — I4891 Unspecified atrial fibrillation: Secondary | ICD-10-CM | POA: Diagnosis present

## 2023-09-15 DIAGNOSIS — I351 Nonrheumatic aortic (valve) insufficiency: Secondary | ICD-10-CM

## 2023-09-15 DIAGNOSIS — I080 Rheumatic disorders of both mitral and aortic valves: Secondary | ICD-10-CM | POA: Diagnosis not present

## 2023-09-15 HISTORY — PX: TEE WITHOUT CARDIOVERSION: SHX5443

## 2023-09-15 HISTORY — PX: CARDIOVERSION: SHX1299

## 2023-09-15 LAB — GLUCOSE, CAPILLARY: Glucose-Capillary: 139 mg/dL — ABNORMAL HIGH (ref 70–99)

## 2023-09-15 LAB — ECHO TEE

## 2023-09-15 SURGERY — ECHOCARDIOGRAM, TRANSESOPHAGEAL
Anesthesia: General

## 2023-09-15 MED ORDER — PHENYLEPHRINE 80 MCG/ML (10ML) SYRINGE FOR IV PUSH (FOR BLOOD PRESSURE SUPPORT)
PREFILLED_SYRINGE | INTRAVENOUS | Status: AC
  Filled 2023-09-15: qty 10

## 2023-09-15 MED ORDER — BUTAMBEN-TETRACAINE-BENZOCAINE 2-2-14 % EX AERO
INHALATION_SPRAY | CUTANEOUS | Status: AC
Start: 2023-09-15 — End: 2023-09-15
  Filled 2023-09-15: qty 5

## 2023-09-15 MED ORDER — GLYCOPYRROLATE 0.2 MG/ML IJ SOLN
INTRAMUSCULAR | Status: AC
  Filled 2023-09-15: qty 1

## 2023-09-15 MED ORDER — PROPOFOL 10 MG/ML IV BOLUS
INTRAVENOUS | Status: AC
Start: 2023-09-15 — End: 2023-09-15
  Filled 2023-09-15: qty 40

## 2023-09-15 MED ORDER — LIDOCAINE VISCOUS HCL 2 % MT SOLN
OROMUCOSAL | Status: AC
  Filled 2023-09-15: qty 15

## 2023-09-15 MED ORDER — PROPOFOL 10 MG/ML IV BOLUS
INTRAVENOUS | Status: AC
Start: 2023-09-15 — End: 2023-09-15
  Filled 2023-09-15: qty 20

## 2023-09-15 MED ORDER — PROPOFOL 10 MG/ML IV BOLUS
INTRAVENOUS | Status: DC | PRN
Start: 2023-09-15 — End: 2023-09-15
  Administered 2023-09-15: 10 mg via INTRAVENOUS
  Administered 2023-09-15: 20 mg via INTRAVENOUS
  Administered 2023-09-15: 90 mg via INTRAVENOUS
  Administered 2023-09-15: 50 mg via INTRAVENOUS
  Administered 2023-09-15: 40 mg via INTRAVENOUS

## 2023-09-15 MED ORDER — SUCCINYLCHOLINE CHLORIDE 200 MG/10ML IV SOSY
PREFILLED_SYRINGE | INTRAVENOUS | Status: AC
Start: 2023-09-15 — End: 2023-09-15
  Filled 2023-09-15: qty 10

## 2023-09-15 MED ORDER — EPHEDRINE 5 MG/ML INJ
INTRAVENOUS | Status: AC
  Filled 2023-09-15: qty 5

## 2023-09-15 NOTE — H&P (Signed)
 Patient was seen and evaluated prior to transesophageal echo and cardioversion procedure  Symptoms, prior testing details again confirmed with the patient Patient examined, no significant change from prior exam Lab work reviewed in detail personally by myself After careful review of history and examination, the risks and benefits of transesophageal echocardiogram have been explained including risks of esophageal damage, perforation (1:10,000 risk), bleeding, pharyngeal hematoma as well as other potential complications associated with conscious sedation including aspiration, arrhythmia, respiratory failure and death. Alternatives to treatment were discussed, questions were answered.  Patient is willing to proceed.   Patient understands risk and benefit of the cardioversion procedure,  The risks (stroke, cardiac arrhythmias rarely resulting in the need for a temporary or permanent pacemaker, skin irritation or burns and complications associated with conscious sedation including aspiration, arrhythmia, respiratory failure and death), benefits (restoration of normal sinus rhythm) and alternatives of a direct current cardioversion were explained in detail Patient willing to proceed.  Signed, Dossie Arbour, MD, Ph.D [facility]

## 2023-09-15 NOTE — Progress Notes (Signed)
 Transesophageal Echocardiogram :  Indication: Long-term persistent atrial fibrillation Requesting/ordering  physician: Dr. Cindie  Procedure: Benzocaine  spray x2 and 2 mls x 2 of viscous lidocaine  were given orally to provide local anesthesia to the oropharynx. The patient was positioned supine on the left side, bite block provided. The patient was moderately sedated with the doses of versed  and fentanyl  as detailed below.  Using digital technique an omniplane probe was advanced into the distal esophagus without incident.   Moderate sedation: 1. Sedation used: Propofol  per anesthesia  See report in EPIC  for complete details: In brief, transgastric imaging revealed normal LV function with no RWMAs and no mural apical thrombus.   Estimated ejection fraction was 55%.  Right sided cardiac chambers were normal with no evidence of pulmonary hypertension.  Imaging of the septum showed no ASD or VSD Bubble study was negative for shunt 2D and color flow confirmed no PFO  Severely dilated left atrium Mild aortic atherosclerosis arch and descending aorta  The LA was well visualized in orthogonal views.  There was no spontaneous contrast and no thrombus in the LA and LA appendage   The descending thoracic aorta had no  mural aortic debris with no evidence of aneurysmal dilation or disection  Cardioversion to follow  Louis Jensen 09/15/2023 12:32 PM

## 2023-09-15 NOTE — Progress Notes (Signed)
*  PRELIMINARY RESULTS* Echocardiogram Echocardiogram Transesophageal has been performed.  Louis Jensen 09/15/2023, 8:21 AM

## 2023-09-15 NOTE — Interval H&P Note (Signed)
 History and Physical Interval Note:  09/15/2023 5:33 PM  Louis Jensen  has presented today for surgery, with the diagnosis of TEE and Cardioversion    Afib.  The various methods of treatment have been discussed with the patient and family. After consideration of risks, benefits and other options for treatment, the patient has consented to  Procedure(s): ECHOCARDIOGRAM, TRANSESOPHAGEAL (N/A) CARDIOVERSION (N/A) as a surgical intervention.  The patient's history has been reviewed, patient examined, no change in status, stable for surgery.  I have reviewed the patient's chart and labs.  Questions were answered to the patient's satisfaction.     [name]

## 2023-09-15 NOTE — Anesthesia Preprocedure Evaluation (Signed)
 Anesthesia Evaluation  Patient identified by MRN, date of birth, ID band Patient awake    Reviewed: Allergy & Precautions, H&P , NPO status , Patient's Chart, lab work & pertinent test results, reviewed documented beta blocker date and time   Airway Mallampati: III   Neck ROM: full    Dental  (+) Poor Dentition   Pulmonary sleep apnea , former smoker   Pulmonary exam normal        Cardiovascular Exercise Tolerance: Poor negative cardio ROS Atrial Fibrillation  Rhythm:regular Rate:Normal     Neuro/Psych  Neuromuscular disease  negative psych ROS   GI/Hepatic negative GI ROS, Neg liver ROS,,,  Endo/Other  negative endocrine ROSdiabetes    Renal/GU negative Renal ROS  negative genitourinary   Musculoskeletal   Abdominal   Peds  Hematology negative hematology ROS (+)   Anesthesia Other Findings Past Medical History: No date: A-fib (HCC) No date: Arrhythmia No date: Arthritis No date: Diabetes mellitus without complication (HCC) No date: Hypercholesteremia No date: Hypercholesteremia No date: Neuropathy No date: Sleep apnea     Comment:  use C-PAP Past Surgical History: No date: APPENDECTOMY 08/26/2015: BICEPT TENODESIS; Right     Comment:  Procedure: BICEPS TENODESIS;  Surgeon: Norleen JINNY Maltos, MD;              Location: [facility] ORS;  Service: Orthopedics;  Laterality:               Right; No date: CARPAL TUNNEL RELEASE; Bilateral No date: JOINT REPLACEMENT; Left     Comment:  Total Knee Replacement, [facility], [place], [place] 02/24/2017: RADIOLOGY WITH ANESTHESIA; N/A     Comment:  Procedure: MRI WITH ANESTHESIA CERVICAL SPINE WITHOUT;                Surgeon: Radiologist, Medication, MD;  Location: [facility] OR;                Service: Radiology;  Laterality: N/A; 08/26/2015: SHOULDER ARTHROSCOPY WITH DEBRIDEMENT AND BICEP TENDON  REPAIR; Right     Comment:  Procedure: SHOULDER ARTHROSCOPY WITH DEBRIDEMENT ;                 Surgeon: Norleen JINNY Maltos, MD;  Location: [facility] ORS;  Service:              Orthopedics;  Laterality: Right; 08/26/2015: SHOULDER ARTHROSCOPY WITH OPEN ROTATOR CUFF REPAIR; Right     Comment:  Procedure: SHOULDER ARTHROSCOPY WITH OPEN ROTATOR CUFF               REPAIR;  Surgeon: Norleen JINNY Maltos, MD;  Location: [facility] ORS;               Service: Orthopedics;  Laterality: Right; 08/26/2015: SHOULDER ARTHROSCOPY WITH SUBACROMIAL DECOMPRESSION; Right     Comment:  Procedure: SHOULDER ARTHROSCOPY WITH SUBACROMIAL               DECOMPRESSION;  Surgeon: Norleen JINNY Maltos, MD;  Location:               [facility] ORS;  Service: Orthopedics;  Laterality: Right; No date: TONSILLECTOMY No date: TOTAL KNEE ARTHROPLASTY   Reproductive/Obstetrics negative OB ROS                              Anesthesia Physical Anesthesia Plan  ASA: 4  Anesthesia Plan: General   Post-op Pain Management:    Induction:   PONV Risk Score and Plan:  3  Airway Management Planned:   Additional Equipment:   Intra-op Plan:   Post-operative Plan:   Informed Consent: I have reviewed the patients History and Physical, chart, labs and discussed the procedure including the risks, benefits and alternatives for the proposed anesthesia with the patient or authorized representative who has indicated his/her understanding and acceptance.     Dental Advisory Given  Plan Discussed with: CRNA  Anesthesia Plan Comments:         Anesthesia Quick Evaluation

## 2023-09-15 NOTE — CV Procedure (Signed)
Cardioversion procedure note For atrial fibrillation, persistent  Procedure Details:  Consent: Risks of procedure as well as the alternatives and risks of each were explained to the (patient/caregiver). Consent for procedure obtained.  Time Out: Verified patient identification, verified procedure, site/side was marked, verified correct patient position, special equipment/implants available, medications/allergies/relevent history reviewed, required imaging and test results available. Performed  Patient placed on cardiac monitor, pulse oximetry, supplemental oxygen as necessary.  Sedation given: propofol IV, Dr. Adams Pacer pads placed anterior and posterior chest.   Cardioverted 1 time(s).  Cardioverted at 200  J. Synchronized biphasic Converted to NSR   Evaluation: Findings: Post procedure EKG shows: NSR Complications: None Patient did tolerate procedure well.  Time Spent Directly with the Patient:  45 minutes   Tim Makhya Arave, M.D., Ph.D. 

## 2023-09-15 NOTE — Transfer of Care (Signed)
 Immediate Anesthesia Transfer of Care Note  Patient: Louis Jensen  Procedure(s) Performed: ECHOCARDIOGRAM, TRANSESOPHAGEAL CARDIOVERSION  Patient Location: [facility]  Anesthesia Type:General  Level of Consciousness: drowsy and patient cooperative  Airway & Oxygen Therapy: Patient Spontanous Breathing  Post-op Assessment: Report given to RN and Post -op Vital signs reviewed and stable  Post vital signs: Reviewed and stable  Last Vitals:  Vitals Value Taken Time  BP 107/79 0818  Temp    Pulse 57 0818  Resp 17 0818  SpO2 97% 0818    Last Pain:  Vitals:   09/15/23 0707  TempSrc: Temporal  PainSc: 0-No pain         Complications: No notable events documented.

## 2023-09-16 ENCOUNTER — Encounter: Payer: Self-pay | Admitting: Cardiovascular Disease

## 2023-09-20 NOTE — Anesthesia Postprocedure Evaluation (Signed)
 Anesthesia Post Note  Patient: Louis Jensen  Procedure(s) Performed: ECHOCARDIOGRAM, TRANSESOPHAGEAL CARDIOVERSION  Patient location during evaluation: PACU Anesthesia Type: General Level of consciousness: awake and alert Pain management: pain level controlled Vital Signs Assessment: post-procedure vital signs reviewed and stable Respiratory status: spontaneous breathing, nonlabored ventilation, respiratory function stable and patient connected to nasal cannula oxygen Cardiovascular status: blood pressure returned to baseline and stable Postop Assessment: no apparent nausea or vomiting Anesthetic complications: no   No notable events documented.   Last Vitals:  Vitals:   09/15/23 0845 09/15/23 0900  BP: 110/78 118/77  Pulse: (!) 57 (!) 55  Resp: 13 18  Temp:    SpO2: 91% 95%    Last Pain:  Vitals:   09/15/23 0900  TempSrc:   PainSc: 0-No pain                 Lynwood KANDICE Clause

## 2023-10-01 NOTE — Progress Notes (Unsigned)
  Electrophysiology Office Follow up Visit Note:    Date:  10/05/2023   ID:  Louis Jensen, DOB [birth year], MRN [id number]  PCP:  Sadie Manna, MD  [facility] Cardiologist:  Evalene Lunger, MD  [facility] Electrophysiologist:  OLE ONEIDA HOLTS, MD    Interval History:     Louis Jensen is a 68 y.o. male who presents for a follow up visit.   I last saw the patient September 07, 2023 for atrial fibrillation.  At the last appointment we decided to pursue cardioversion to see if he would maintain normal rhythm.  The patient underwent TEE guided cardioversion September 15, 2023.  He is doing well today.  He did not notice any difference in symptoms after his cardioversion.  He was unaware that he was back out of rhythm today.      Past medical, surgical, social and family history were reviewed.  ROS:   Please see the history of present illness.    All other systems reviewed and are negative.  EKGs/Labs/Other Studies Reviewed:    The following studies were reviewed today:     EKG Interpretation Date/Time:  Wednesday October 05 2023 10:16:59 EDT Ventricular Rate:  66 PR Interval:    QRS Duration:  90 QT Interval:  388 QTC Calculation: 406 R Axis:   115  Text Interpretation: Atrial flutter with variable A-V block Confirmed by HOLTS OLE [id number]) on 10/05/2023 10:24:03 AM    Physical Exam:    VS:  BP 130/77   Pulse 66   Ht 5' 10 (1.778 m)   Wt 265 lb 3.2 oz (120.3 kg)   SpO2 96%   BMI 38.05 kg/m     Wt Readings from Last 3 Encounters:  10/05/23 265 lb 3.2 oz (120.3 kg)  09/15/23 260 lb (117.9 kg)  09/07/23 261 lb (118.4 kg)     GEN: no distress.  Obese CARD: Irregularly irregular, No MRG RESP: No IWOB. CTAB.      ASSESSMENT:    1. Longstanding persistent atrial fibrillation (HCC)    PLAN:    In order of problems listed above:  #Permanent atrial fibrillation Post cardioversion on September 15, 2023. Had return of AF.  Asymptomatic.  I  suspect he has been in atrial fibrillation for several years. Continue Pradaxa and metoprolol  Follow-up with EP on an as-needed basis.  Should have routine follow-up moving forward with general cardiology.   Signed, OLE HOLTS, MD, [facility], [facility] 10/05/2023 10:28 AM    Electrophysiology [facility]

## 2023-10-05 ENCOUNTER — Encounter: Payer: Self-pay | Admitting: Cardiology

## 2023-10-05 ENCOUNTER — Ambulatory Visit: Attending: Cardiology | Admitting: Cardiology

## 2023-10-05 VITALS — BP 130/77 | HR 66 | Ht 70.0 in | Wt 265.2 lb

## 2023-10-05 DIAGNOSIS — I4811 Longstanding persistent atrial fibrillation: Secondary | ICD-10-CM | POA: Insufficient documentation

## 2023-10-05 NOTE — Patient Instructions (Signed)
 Medication Instructions:  Your physician recommends that you continue on your current medications as directed. Please refer to the Current Medication list given to you today.  *If you need a refill on your cardiac medications before your next appointment, please call your pharmacy*  Follow-Up: At [facility], you and your health needs are our priority.  As part of our continuing mission to provide you with exceptional heart care, our providers are all part of one team.  This team includes your primary Cardiologist (physician) and Advanced Practice Providers or APPs (Physician Assistants and Nurse Practitioners) who all work together to provide you with the care you need, when you need it.  Your next appointment:   As needed with Dr. Cindie

## 2023-11-08 ENCOUNTER — Ambulatory Visit: Admitting: Student in an Organized Health Care Education/Training Program

## 2023-11-09 ENCOUNTER — Ambulatory Visit (INDEPENDENT_AMBULATORY_CARE_PROVIDER_SITE_OTHER): Admitting: Podiatry

## 2023-11-09 DIAGNOSIS — B351 Tinea unguium: Secondary | ICD-10-CM

## 2023-11-09 DIAGNOSIS — M79676 Pain in unspecified toe(s): Secondary | ICD-10-CM

## 2023-11-09 DIAGNOSIS — E1142 Type 2 diabetes mellitus with diabetic polyneuropathy: Secondary | ICD-10-CM | POA: Diagnosis not present

## 2023-11-09 NOTE — Progress Notes (Signed)
 He presents today chief complaint of painful elongated toenails.  Objective: Pulses are palpable.  No open lesions or wounds.  Toenails are long thick yellow dystrophic clinically mycotic.  No change in neuropathy.  Assessment: Diabetic peripheral neuropathy painful elongated toenails.  Plan: Debridement of toenails 1 through 5 bilateral.

## 2023-11-15 ENCOUNTER — Ambulatory Visit: Admitting: Student in an Organized Health Care Education/Training Program

## 2023-11-22 ENCOUNTER — Encounter: Payer: Self-pay | Admitting: Student in an Organized Health Care Education/Training Program

## 2023-11-22 ENCOUNTER — Ambulatory Visit
Attending: Student in an Organized Health Care Education/Training Program | Admitting: Student in an Organized Health Care Education/Training Program

## 2023-11-22 VITALS — BP 110/88 | HR 104 | Temp 98.1°F | Resp 16 | Ht 70.5 in | Wt 258.0 lb

## 2023-11-22 DIAGNOSIS — M792 Neuralgia and neuritis, unspecified: Secondary | ICD-10-CM | POA: Diagnosis not present

## 2023-11-22 DIAGNOSIS — Z7984 Long term (current) use of oral hypoglycemic drugs: Secondary | ICD-10-CM

## 2023-11-22 DIAGNOSIS — G894 Chronic pain syndrome: Secondary | ICD-10-CM | POA: Diagnosis present

## 2023-11-22 DIAGNOSIS — E1140 Type 2 diabetes mellitus with diabetic neuropathy, unspecified: Secondary | ICD-10-CM | POA: Insufficient documentation

## 2023-11-22 MED ORDER — GABAPENTIN 600 MG PO TABS: ORAL_TABLET | ORAL | 2 refills | Status: AC

## 2023-11-22 NOTE — Progress Notes (Signed)
 PROVIDER NOTE: Interpretation of information contained herein should be left to medically-trained personnel. Specific patient instructions are provided elsewhere under Patient Instructions section of medical record. This document was created in part using AI and STT-dictation technology, any transcriptional errors that may result from this process are unintentional.  Patient: Louis Jensen  Service: E/M   PCP: Sadie Manna, MD  DOB: 21-Oct-1955  DOS: 11/22/2023  Provider: Wallie Sherry, MD  MRN: [id number]  Delivery: Face-to-face  Specialty: Interventional Pain Management  Type: Established Patient  Setting: Ambulatory outpatient facility  Specialty designation: 09  Referring Prov.: Eliverto Bette Hover, *  Location: Outpatient office facility       History of present illness (HPI) Mr. Louis Jensen, a 68 y.o. 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 Mr. Louis Jensen primary complain today is Foot Pain (Bilateral )  Pertinent problems: Louis Jensen does not have any pertinent problems on file.  Pain Assessment: Severity of Chronic pain is reported as a 5 /10. Location: Foot Left, Right/ . Onset: More than a month ago. Quality: Constant, Burning, Discomfort. Timing: Constant. Modifying factor(s): nothing currently. Vitals:  height is 5' 10.5 (1.791 m) and weight is 258 lb (117 kg). His temporal temperature is 98.1 F (36.7 C). His blood pressure is 110/88 and his pulse is 104 (abnormal). His respiration is 16 and oxygen saturation is 98%.  BMI: Estimated body mass index is 36.5 kg/m as calculated from the following:   Height as of this encounter: 5' 10.5 (1.791 m).   Weight as of this encounter: 258 lb (117 kg).  Last encounter: 08/09/2023. Last procedure: 09/05/2023.  Reason for encounter:   Discussed the use of AI scribe software for clinical note transcription with the patient, who gave verbal consent to proceed.  History of  Present Illness   Louis Jensen is a 68 year old male who presents for follow-up regarding pain management with Qutenza  and gabapentin.  He has been using Qutenza  for neuropathic pain management and reports some response to the treatment. However, he feels the need to supplement it with gabapentin for better pain control.  He was previously on gabapentin at a dose of 2400 mg daily, divided as 600 mg in the morning and 1800 mg at night. He found the morning dose difficult to tolerate and adjusted to 600 mg in the morning and 1800 mg at night, which he also found challenging.  He is considering a new regimen of 600 mg in the morning and 1200 mg at night, which he believes he can handle better.        ROS  Constitutional: Denies any fever or chills Gastrointestinal: No reported hemesis, hematochezia, vomiting, or acute GI distress Musculoskeletal: Denies any acute onset joint swelling, redness, loss of ROM, or weakness Neurological: Paresthesias, bilateral feet  Medication Review  DULoxetine, atorvastatin, dabigatran , gabapentin, metFORMIN, metoprolol  succinate, and traMADol  History Review  Allergy: Louis Jensen is allergic to pregabalin. Drug: Louis Jensen  reports no history of drug use. Alcohol:  reports current alcohol use. Tobacco:  reports that he has quit smoking. His smoking use included cigarettes. He has never used smokeless tobacco. Social: Louis Jensen  reports that he has quit smoking. His smoking use included cigarettes. He has never used smokeless tobacco. He reports current alcohol use. He reports that he does not use drugs. Medical:  has a past medical history of A-fib (HCC), Arrhythmia, Arthritis, Diabetes mellitus without complication (HCC), Hypercholesteremia, Hypercholesteremia,  Neuropathy, and Sleep apnea. Surgical: Louis Jensen  has a past surgical history that includes Total knee arthroplasty; Tonsillectomy; Joint replacement (Left); Carpal tunnel release (Bilateral);  Shoulder arthroscopy with debridement and bicep tendon repair (Right, 08/26/2015); Shoulder arthroscopy with open rotator cuff repair (Right, 08/26/2015); Shoulder arthroscopy with subacromial decompression (Right, 08/26/2015); Bicept tenodesis (Right, 08/26/2015); Appendectomy; Radiology with anesthesia (N/A, 02/24/2017); TEE without cardioversion (N/A, 09/15/2023); and Cardioversion (N/A, 09/15/2023). Family: family history is not on file.  Laboratory Chemistry Profile   Renal Lab Results  Component Value Date   BUN 14 09/07/2023   CREATININE 0.81 09/07/2023   BCR 17 09/07/2023   GFRAA >60 02/24/2017   GFRNONAA >60 02/24/2017    Hepatic Lab Results  Component Value Date   AST 27 05/20/2009   ALT 37 05/20/2009   ALBUMIN 4.2 05/20/2009   ALKPHOS 67 05/20/2009    Electrolytes Lab Results  Component Value Date   NA 138 09/07/2023   K 4.4 09/07/2023   CL 101 09/07/2023   CALCIUM 9.2 09/07/2023    Bone No results found for: VD25OH, VD125OH2TOT, CI6874NY7, CI7874NY7, 25OHVITD1, 25OHVITD2, 25OHVITD3, TESTOFREE, TESTOSTERONE  Inflammation (CRP: Acute Phase) (ESR: Chronic Phase) No results found for: CRP, ESRSEDRATE, LATICACIDVEN       Note: Above Lab results reviewed.  Recent Imaging Review  ECHO TEE    TRANSESOPHOGEAL ECHO REPORT       Patient Name:   Louis Jensen Date of Exam: 09/15/2023 Medical Rec #:  [id number]        Height:       70.0 in Accession #:    [id number]       Weight:       261.0 lb Date of Birth:  March 27, 1955        BSA:          2.337 m Patient Age:    34 years         BP:           124/72 mmHg Patient Gender: M                HR:           80 bpm. Exam Location:  [facility]  Procedure: Transesophageal Echo, Cardiac Doppler, Color Doppler and Saline            Contrast Bubble Study (Both Spectral and Color Flow Doppler were            utilized during procedure).  Indications:     Pre-op evaluation Z01.818   History:         Patient has  no prior history of Echocardiogram examinations.                  Arrythmias:Atrial Fibrillation; Risk Factors:Diabetes and Sleep                  Apnea.   Sonographer:     Christopher Furnace Referring Phys:  [id number] EVALENE JINNY LUNGER Diagnosing Phys: EVALENE LUNGER MD  PROCEDURE: After discussion of the risks and benefits of a TEE, an informed consent was obtained from the patient. TEE procedure time was 30 minutes. The transesophogeal probe was passed without difficulty through the esophogus of the patient. Imaged  were obtained with the patient in a left lateral decubitus position. Local oropharyngeal anesthetic was provided with Cetacaine  and viscous lidocaine . Sedation performed by different physician. Image quality was excellent. The patient's vital signs;  including heart rate, blood pressure, and oxygen saturation; remained  stable throughout the procedure. The patient developed no complications during the procedure. A successful direct current cardioversion was performed at 200 joules with 1 attempt.   IMPRESSIONS   1. Left ventricular ejection fraction, by estimation, is 50 to 55%. The left ventricle has low normal function. The left ventricle has no regional wall motion abnormalities.  2. Right ventricular systolic function is normal. The right ventricular size is normal.  3. Left atrial size was severely dilated. No left atrial/left atrial appendage thrombus was detected.  4. The mitral valve is normal in structure. Mild mitral valve regurgitation. No evidence of mitral stenosis.  5. The aortic valve is normal in structure. Aortic valve regurgitation is mild. No aortic stenosis is present.  6. The inferior vena cava is normal in size with greater than 50% respiratory variability, suggesting right atrial pressure of 3 mmHg.  7. Agitated saline contrast bubble study was negative, with no evidence of any interatrial shunt.  8. Right atrial size was mildly dilated.  9. There is mild (Grade II)  atheroma plaque involving the aortic arch and descending aorta.  Conclusion(s)/Recommendation(s): Challenging procedure secondary to Apnea  FINDINGS  Left Ventricle: Left ventricular ejection fraction, by estimation, is 50 to 55%. The left ventricle has low normal function. The left ventricle has no regional wall motion abnormalities. The left ventricular internal cavity size was normal in size.  There is no left ventricular hypertrophy.  Right Ventricle: The right ventricular size is normal. No increase in right ventricular wall thickness. Right ventricular systolic function is normal.  Left Atrium: Left atrial size was severely dilated. No left atrial/left atrial appendage thrombus was detected.  Right Atrium: Right atrial size was mildly dilated.  Pericardium: There is no evidence of pericardial effusion.  Mitral Valve: The mitral valve is normal in structure. Mild mitral valve regurgitation. No evidence of mitral valve stenosis.  Tricuspid Valve: The tricuspid valve is normal in structure. Tricuspid valve regurgitation is mild . No evidence of tricuspid stenosis.  Aortic Valve: The aortic valve is normal in structure. Aortic valve regurgitation is mild. No aortic stenosis is present.  Pulmonic Valve: The pulmonic valve was normal in structure. Pulmonic valve regurgitation is not visualized. No evidence of pulmonic stenosis.  Aorta: The aortic root is normal in size and structure. There is mild (Grade II) atheroma plaque involving the aortic arch and descending aorta.  Venous: The inferior vena cava is normal in size with greater than 50% respiratory variability, suggesting right atrial pressure of 3 mmHg.  IAS/Shunts: No atrial level shunt detected by color flow Doppler. Agitated saline contrast was given intravenously to evaluate for intracardiac shunting. Agitated saline contrast bubble study was negative, with no evidence of any interatrial shunt. There  is no evidence of a patent  foramen ovale. There is no evidence of an atrial septal defect.  Additional Comments: 3D was performed not requiring image post processing on an independent workstation and was indeterminate.  Timothy Gollan MD Electronically signed by Evalene Lunger MD Signature Date/Time: 09/15/2023/2:22:05 PM      Final   Note: Reviewed        Physical Exam  Vitals: BP 110/88 (BP Location: Right Arm, Patient Position: Sitting, Cuff Size: Large)   Pulse (!) 104   Temp 98.1 F (36.7 C) (Temporal)   Resp 16   Ht 5' 10.5 (1.791 m)   Wt 258 lb (117 kg)   SpO2 98%   BMI 36.50 kg/m  BMI: Estimated body mass index is 36.5 kg/m  as calculated from the following:   Height as of this encounter: 5' 10.5 (1.791 m).   Weight as of this encounter: 258 lb (117 kg). Ideal: Ideal body weight: 74.1 kg (163 lb 7.5 oz) Adjusted ideal body weight: 91.3 kg (201 lb 4.5 oz) General appearance: Well nourished, well developed, and well hydrated. In no apparent acute distress Mental status: Alert, oriented x 3 (person, place, & time)       Respiratory: No evidence of acute respiratory distress Eyes: PERLA  Paresthesias of bilateral feet Assessment   Diagnosis  1. Chronic painful diabetic neuropathy (HCC)   2. Neuropathic pain of lower extremity   3. Chronic pain syndrome      Updated Problems: No problems updated.  Plan of Care  Assessment and Plan    Neuropathic pain   Chronic neuropathic pain shows partial response to Qutenza . Gabapentin at 2400 mg daily was not well tolerated, especially the morning dose. Prescribe gabapentin 600 mg in the morning and 1200 mg at night, totaling 1800 mg daily. Provide 90 tablets for a month with two refills. Schedule Qutenza  treatment on December 07, 2023.      1. Chronic painful diabetic neuropathy (HCC) (Primary) - NEUROLYSIS; Future - gabapentin (NEURONTIN) 600 MG tablet; 600 mg qAM, 1200 mg qhs  Dispense: 90 tablet; Refill: 2  2. Neuropathic pain of lower  extremity - NEUROLYSIS; Future - gabapentin (NEURONTIN) 600 MG tablet; 600 mg qAM, 1200 mg qhs  Dispense: 90 tablet; Refill: 2  3. Chronic pain syndrome - NEUROLYSIS; Future - gabapentin (NEURONTIN) 600 MG tablet; 600 mg qAM, 1200 mg qhs  Dispense: 90 tablet; Refill: 2    Louis Jensen has a current medication list which includes the following long-term medication(s): atorvastatin, dabigatran , duloxetine, gabapentin, and metoprolol  succinate.  Pharmacotherapy (Medications Ordered): Meds ordered this encounter  Medications   gabapentin (NEURONTIN) 600 MG tablet    Sig: 600 mg qAM, 1200 mg qhs    Dispense:  90 tablet    Refill:  2   Orders:  Orders Placed This Encounter  Procedures   NEUROLYSIS    Please order Qutenza  patches    Standing Status:   Future    Expiration Date:   02/22/2024    Where will this procedure be performed?:   [facility]    Return in about 15 days (around 12/07/2023) for Qutenza  #2.    Recent Visits Date Type Provider Dept  09/05/23 Procedure visit Marcelino Nurse, MD [facility]  Showing recent visits within past 90 days and meeting all other requirements Today's Visits Date Type Provider Dept  11/22/23 Office Visit Marcelino Nurse, MD [facility]  Showing today's visits and meeting all other requirements Future Appointments Date Type Provider Dept  12/14/23 Appointment Marcelino Nurse, MD [facility]  Showing future appointments within next 90 days and meeting all other requirements  I discussed the assessment and treatment plan with the patient. The patient was provided an opportunity to ask questions and all were answered. The patient agreed with the plan and demonstrated an understanding of the instructions.  Patient advised to call back or seek an in-person evaluation if the symptoms or condition worsens.  I personally spent a total of 30 minutes in the care of the patient today including preparing to  see the patient, getting/reviewing separately obtained history, performing a medically appropriate exam/evaluation, counseling and educating, placing orders, and documenting clinical information in the EHR.   Note by: Nurse Marcelino, MD (  TTS and AI technology used. I apologize for any typographical errors that were not detected and corrected.) Date: 11/22/2023; Time: 3:28 PM

## 2023-12-14 ENCOUNTER — Encounter: Payer: Self-pay | Admitting: Student in an Organized Health Care Education/Training Program

## 2023-12-14 ENCOUNTER — Ambulatory Visit
Attending: Student in an Organized Health Care Education/Training Program | Admitting: Student in an Organized Health Care Education/Training Program

## 2023-12-14 VITALS — BP 110/72 | HR 16 | Temp 97.2°F | Resp 16 | Ht 70.0 in | Wt 258.0 lb

## 2023-12-14 DIAGNOSIS — E1140 Type 2 diabetes mellitus with diabetic neuropathy, unspecified: Secondary | ICD-10-CM | POA: Diagnosis present

## 2023-12-14 DIAGNOSIS — G894 Chronic pain syndrome: Secondary | ICD-10-CM | POA: Insufficient documentation

## 2023-12-14 DIAGNOSIS — M792 Neuralgia and neuritis, unspecified: Secondary | ICD-10-CM | POA: Diagnosis present

## 2023-12-14 MED ORDER — CAPSAICIN-CLEANSING GEL 8 % EX KIT
4.0000 | PACK | Freq: Once | CUTANEOUS | Status: AC
  Administered 2023-12-14: 4 via TOPICAL

## 2023-12-14 NOTE — Progress Notes (Signed)
 PROVIDER NOTE: Interpretation of information contained herein should be left to medically-trained personnel. Specific patient instructions are provided elsewhere under Patient Instructions section of medical record. This document was created in part using STT-dictation technology, any transcriptional errors that may result from this process are unintentional.  Patient: Louis Jensen Type: Established DOB: 10/03/1955 MRN: [id number] PCP: Sadie Manna, MD  Service: Procedure DOS: 12/14/2023 Setting: Ambulatory Location: Ambulatory outpatient facility Delivery: Face-to-face Provider: Wallie Sherry, MD Specialty: Interventional Pain Management Specialty designation: 09 Location: Outpatient facility Ref. Prov.: Sadie Manna, MD       Interventional Therapy   Type: Qutenza  Neurolysis #2  Laterality:  Bilateral Area treated: Feet Imaging Guidance: None Anesthesia/analgesia/anxiolysis/sedation: None required Medication (Right): Qutenza  (capsaicin  8%) topical system Medication (Left): Qutenza  (capsaicin  8%) topical system Date: 12/14/2023 Performed by: Wallie Sherry, MD Rationale (medical necessity): procedure needed and proper for the treatment of Louis Jensen medical symptoms and needs. Indication: Painful diabetic peripheral neuralgia (DPN) (ICD-10-CM:E11.40) severe enough to impact quality of life or function. 1. Chronic painful diabetic neuropathy (HCC)   2. Neuropathic pain of lower extremity   3. Chronic pain syndrome    NAS-11 Pain score:   Pre-procedure: 6 /10   Post-procedure: 6 /10     Position / Prep / Materials:  Position: Supine  Materials: Qutenza  Kit (4 patches)  H&P (Pre-op Assessment):  Louis Jensen is a 68 y.o. (year old), male patient, seen today for interventional treatment. He  has a past surgical history that includes Total knee arthroplasty; Tonsillectomy; Joint replacement (Left); Carpal tunnel release (Bilateral); Shoulder arthroscopy with  debridement and bicep tendon repair (Right, 08/26/2015); Shoulder arthroscopy with open rotator cuff repair (Right, 08/26/2015); Shoulder arthroscopy with subacromial decompression (Right, 08/26/2015); Bicept tenodesis (Right, 08/26/2015); Appendectomy; Radiology with anesthesia (N/A, 02/24/2017); TEE without cardioversion (N/A, 09/15/2023); and Cardioversion (N/A, 09/15/2023). Louis Jensen has a current medication list which includes the following prescription(s): atorvastatin, dabigatran , duloxetine, gabapentin, metformin, metoprolol  succinate, and tramadol. His primarily concern today is the Foot Pain (BILATERAL)  Initial Vital Signs:  Pulse/HCG Rate: (!) 16  Temp: (!) 97.2 F (36.2 C) Resp: 16 BP: 110/72 SpO2: 98 %  BMI: Estimated body mass index is 37.02 kg/m as calculated from the following:   Height as of this encounter: 5' 10 (1.778 m).   Weight as of this encounter: 258 lb (117 kg).  Risk Assessment: Allergies: Reviewed. He is allergic to pregabalin.  Allergy Precautions: None required Coagulopathies: Reviewed. None identified.  Blood-thinner therapy: None at this time Active Infection(s): Reviewed. None identified. Louis Jensen is afebrile  Site Confirmation: Louis Jensen was asked to confirm the procedure and laterality before marking the site Procedure checklist: Completed Consent: Before the procedure and under the influence of no sedative(s), amnesic(s), or anxiolytics, the patient was informed of the treatment options, risks and possible complications. To fulfill our ethical and legal obligations, as recommended by the American Medical Association's Code of Ethics, I have informed the patient of my clinical impression; the nature and purpose of the treatment or procedure; the risks, benefits, and possible complications of the intervention; the alternatives, including doing nothing; the risk(s) and benefit(s) of the alternative treatment(s) or procedure(s); and the risk(s) and benefit(s)  of doing nothing. The patient was provided information about the general risks and possible complications associated with the procedure. These may include, but are not limited to: failure to achieve desired goals, infection, bleeding, organ or nerve damage, allergic reactions, paralysis, and death. In addition, the patient was informed of those  risks and complications associated to the procedure, such as failure to decrease pain; infection; bleeding; organ or nerve damage with subsequent damage to sensory, motor, and/or autonomic systems, resulting in permanent pain, numbness, and/or weakness of one or several areas of the body; allergic reactions; (i.e.: anaphylactic reaction); and/or death. Furthermore, the patient was informed of those risks and complications associated with the medications. These include, but are not limited to: allergic reactions (i.e.: anaphylactic or anaphylactoid reaction(s)); adrenal axis suppression; blood sugar elevation that in diabetics may result in ketoacidosis or comma; water retention that in patients with history of congestive heart failure may result in shortness of breath, pulmonary edema, and decompensation with resultant heart failure; weight gain; swelling or edema; medication-induced neural toxicity; particulate matter embolism and blood vessel occlusion with resultant organ, and/or nervous system infarction; and/or aseptic necrosis of one or more joints. Finally, the patient was informed that Medicine is not an exact science; therefore, there is also the possibility of unforeseen or unpredictable risks and/or possible complications that may result in a catastrophic outcome. The patient indicated having understood very clearly. We have given the patient no guarantees and we have made no promises. Enough time was given to the patient to ask questions, all of which were answered to the patient's satisfaction. Louis Jensen has indicated that he wanted to continue with the  procedure. Attestation: I, the ordering provider, attest that I have discussed with the patient the benefits, risks, side-effects, alternatives, likelihood of achieving goals, and potential problems during recovery for the procedure that I have provided informed consent. Date  Time: 12/14/2023 11:30 AM  Pre-Procedure Preparation:  Monitoring: As per clinic protocol. Respiration, ETCO2, SpO2, BP, heart rate and rhythm monitor placed and checked for adequate function Safety Precautions: Patient was assessed for positional comfort and pressure points before starting the procedure. Time-out: I initiated and conducted the Time-out before starting the procedure, as per protocol. The patient was asked to participate by confirming the accuracy of the Time Out information. Verification of the correct person, site, and procedure were performed and confirmed by me, the nursing staff, and the patient. Time-out conducted as per Joint Commission's Universal Protocol (UP.01.01.01). Time: 1144 Start Time: 1145 hrs.  Description/Narrative of Procedure:          Region: Distal lower extremities Target Area: Sensory peripheral nerves affected by diabetic peripheral neuropathy Site: Feet Approach: Percutaneous  No./Series: Not applicable  Type: Percutaneous  Purpose: Therapeutic  Region: Distal lower extremities  Start Time: 1145 hrs.  Description of the Procedure: Protocol guidelines were followed. The patient was assisted into a comfortable position.  Informed consent was obtained in the patient monitored in the usual manner.  All questions were answered prior to the procedure.  They Qutenza  patches were applied to the affected area and then covered with the wrap.  The Patient was kept under observation until the treatment was completed.  The patches were removed and the treated area was inspected.  Vitals:   12/14/23 1128  BP: 110/72  Pulse: (!) 16  Resp: 16  Temp: (!) 97.2 F (36.2 C)  SpO2:  98%  Weight: 258 lb (117 kg)  Height: 5' 10 (1.778 m)     End Time: 1232 hrs.  Type of Imaging Technique: None used Indication(s): N/A Exposure Time: No patient exposure Contrast: None used. Fluoroscopic Guidance: N/A Ultrasound Guidance: N/A Interpretation: N/A  Post-operative Assessment:  Post-procedure Vital Signs:  Pulse/HCG Rate: (!) 16  Temp: (!) 97.2 F (36.2 C) Resp: 16  BP: 110/72 SpO2: 98 %  EBL: None  Complications: No immediate post-treatment complications observed by team, or reported by patient.  Note: The patient tolerated the entire procedure well. A repeat set of vitals were taken after the procedure and the patient was kept under observation following institutional policy, for this type of procedure. Post-procedural neurological assessment was performed, showing return to baseline, prior to discharge. The patient was provided with post-procedure discharge instructions, including a section on how to identify potential problems. Should any problems arise concerning this procedure, the patient was given instructions to immediately contact us , at any time, without hesitation. In any case, we plan to contact the patient by telephone for a follow-up status report regarding this interventional procedure.  Comments:  No additional relevant information.  Plan of Care (POC)  Orders:  Orders Placed This Encounter  Procedures   NEUROLYSIS    Please order Qutenza  patches    Standing Status:   Future    Expiration Date:   03/15/2024    Where will this procedure be performed?:   [facility]     Medications ordered for procedure: Meds ordered this encounter  Medications   capsaicin  topical system 8 % patch 4 patch    Disregard order if transmitted to pharmacy. Qutenza  Kit to be used from [facility].   Medications administered: We administered capsaicin  topical system.  See the medical record for exact dosing, route, and time of  administration.    Follow-up plan:   Return in about 3 months (around 03/15/2024) for Qutenza  3.     Recent Visits Date Type Provider Dept  11/22/23 Office Visit Marcelino Nurse, MD [facility]  Showing recent visits within past 90 days and meeting all other requirements Today's Visits Date Type Provider Dept  12/14/23 Procedure visit Marcelino Nurse, MD [facility]  Showing today's visits and meeting all other requirements Future Appointments No visits were found meeting these conditions. Showing future appointments within next 90 days and meeting all other requirements   Disposition: Discharge home  Discharge (Date  Time): 12/14/2023; 1233 hrs.   Primary Care Physician: Sadie Manna, MD Location: [facility] Note by: Nurse Marcelino, MD (TTS technology used. I apologize for any typographical errors that were not detected and corrected.) Date: 12/14/2023; Time: 1:22 PM  Disclaimer:  Medicine is not an visual merchandiser. The only guarantee in medicine is that nothing is guaranteed. It is important to note that the decision to proceed with this intervention was based on the information collected from the patient. The Data and conclusions were drawn from the patient's questionnaire, the interview, and the physical examination. Because the information was provided in large part by the patient, it cannot be guaranteed that it has not been purposely or unconsciously manipulated. Every effort has been made to obtain as much relevant data as possible for this evaluation. It is important to note that the conclusions that lead to this procedure are derived in large part from the available data. Always take into account that the treatment will also be dependent on availability of resources and existing treatment guidelines, considered by other Pain Management Practitioners as being common knowledge and practice, at the time of the intervention. For  Medico-Legal purposes, it is also important to point out that variation in procedural techniques and pharmacological choices are the acceptable norm. The indications, contraindications, technique, and results of the above procedure should only be interpreted and judged by a Board-Certified Interventional  Pain Specialist with extensive familiarity and expertise in the same exact procedure and technique.

## 2023-12-14 NOTE — Progress Notes (Signed)
 Safety precautions to be maintained throughout the outpatient stay will include: orient to surroundings, keep bed in low position, maintain call bell within reach at all times, provide assistance with transfer out of bed and ambulation.

## 2023-12-15 ENCOUNTER — Telehealth: Payer: Self-pay | Admitting: *Deleted

## 2023-12-15 NOTE — Telephone Encounter (Signed)
 Post procedure call; voicemail left

## 2023-12-20 MED FILL — Capsaicin Patch 8% & Cleansing Gel Kit: CUTANEOUS | Qty: 4 | Status: AC

## 2024-02-13 ENCOUNTER — Ambulatory Visit: Admitting: Podiatry

## 2024-02-13 DIAGNOSIS — M79676 Pain in unspecified toe(s): Secondary | ICD-10-CM | POA: Diagnosis not present

## 2024-02-13 DIAGNOSIS — E1142 Type 2 diabetes mellitus with diabetic polyneuropathy: Secondary | ICD-10-CM | POA: Diagnosis not present

## 2024-02-13 DIAGNOSIS — B351 Tinea unguium: Secondary | ICD-10-CM

## 2024-02-13 NOTE — Progress Notes (Signed)
 He presents today chief complaint of painful elongated toenails.  Objective: Pulses are palpable.  No open lesions or wounds.  Toenails are long thick yellow dystrophic clinically mycotic.  No change in neuropathy.  Assessment: Diabetic peripheral neuropathy painful elongated toenails.  Plan: Debridement of toenails 1 through 5 bilateral.

## 2024-02-14 ENCOUNTER — Other Ambulatory Visit: Payer: Self-pay | Admitting: Student in an Organized Health Care Education/Training Program

## 2024-02-14 DIAGNOSIS — M792 Neuralgia and neuritis, unspecified: Secondary | ICD-10-CM

## 2024-02-14 DIAGNOSIS — G894 Chronic pain syndrome: Secondary | ICD-10-CM

## 2024-02-14 DIAGNOSIS — E1140 Type 2 diabetes mellitus with diabetic neuropathy, unspecified: Secondary | ICD-10-CM

## 2024-03-18 ENCOUNTER — Telehealth: Payer: Self-pay

## 2024-03-19 ENCOUNTER — Ambulatory Visit: Admitting: Student in an Organized Health Care Education/Training Program

## 2024-03-26 ENCOUNTER — Ambulatory Visit: Payer: Self-pay | Admitting: Student in an Organized Health Care Education/Training Program

## 2024-05-16 ENCOUNTER — Ambulatory Visit: Admitting: Podiatry
